# Patient Record
Sex: Male | Born: 1975 | Race: Black or African American | Hispanic: No | Marital: Single | Smoking: Current some day smoker
Health system: Southern US, Community
[De-identification: ages and names within clinical notes are randomized; demographics above are authoritative.]

## PROBLEM LIST (undated history)

## (undated) DIAGNOSIS — Q181 Preauricular sinus and cyst: Secondary | ICD-10-CM

## (undated) DIAGNOSIS — J45909 Unspecified asthma, uncomplicated: Secondary | ICD-10-CM

## (undated) DIAGNOSIS — I10 Essential (primary) hypertension: Secondary | ICD-10-CM

## (undated) DIAGNOSIS — E785 Hyperlipidemia, unspecified: Secondary | ICD-10-CM

## (undated) DIAGNOSIS — E119 Type 2 diabetes mellitus without complications: Secondary | ICD-10-CM

## (undated) DIAGNOSIS — G4730 Sleep apnea, unspecified: Secondary | ICD-10-CM

## (undated) HISTORY — DX: Sleep apnea, unspecified: G47.30

## (undated) HISTORY — DX: Type 2 diabetes mellitus without complications: E11.9

## (undated) HISTORY — PX: ANKLE SURGERY: SHX546

## (undated) HISTORY — DX: Hyperlipidemia, unspecified: E78.5

---

## 2005-09-01 ENCOUNTER — Emergency Department (HOSPITAL_COMMUNITY): Admission: EM | Admit: 2005-09-01 | Discharge: 2005-09-01 | Payer: Self-pay | Admitting: Emergency Medicine

## 2008-08-11 ENCOUNTER — Emergency Department (HOSPITAL_COMMUNITY): Admission: EM | Admit: 2008-08-11 | Discharge: 2008-08-11 | Payer: Self-pay | Admitting: Emergency Medicine

## 2008-08-15 ENCOUNTER — Emergency Department (HOSPITAL_COMMUNITY): Admission: EM | Admit: 2008-08-15 | Discharge: 2008-08-16 | Payer: Self-pay | Admitting: Emergency Medicine

## 2011-06-28 LAB — DIFFERENTIAL
Basophils Absolute: 0
Basophils Absolute: 0
Eosinophils Absolute: 0.2
Eosinophils Relative: 3
Lymphocytes Relative: 40
Lymphs Abs: 2.2
Monocytes Absolute: 0.4
Monocytes Relative: 6
Neutro Abs: 4.4

## 2011-06-28 LAB — URINE MICROSCOPIC-ADD ON

## 2011-06-28 LAB — POCT I-STAT, CHEM 8
BUN: 10
BUN: 13
Calcium, Ion: 1.19
Chloride: 104
Chloride: 105
Creatinine, Ser: 1.5
Glucose, Bld: 119 — ABNORMAL HIGH
HCT: 41
Hemoglobin: 13.9
Potassium: 3 — ABNORMAL LOW
Potassium: 3.2 — ABNORMAL LOW
Sodium: 140
Sodium: 140
TCO2: 28

## 2011-06-28 LAB — URINALYSIS, ROUTINE W REFLEX MICROSCOPIC
Bilirubin Urine: NEGATIVE
Glucose, UA: NEGATIVE
Glucose, UA: NEGATIVE
Ketones, ur: NEGATIVE
Leukocytes, UA: NEGATIVE
Leukocytes, UA: NEGATIVE
Nitrite: NEGATIVE
Protein, ur: NEGATIVE
Protein, ur: NEGATIVE
Specific Gravity, Urine: 1.014
Urobilinogen, UA: 0.2
pH: 7

## 2011-06-28 LAB — CBC
HCT: 39.9
Hemoglobin: 13.6
Hemoglobin: 14.1
MCV: 89.8
Platelets: 224
RBC: 4.44
RDW: 13.7
WBC: 6.8

## 2013-02-24 ENCOUNTER — Encounter (HOSPITAL_BASED_OUTPATIENT_CLINIC_OR_DEPARTMENT_OTHER): Payer: Self-pay | Admitting: *Deleted

## 2013-02-24 ENCOUNTER — Emergency Department (HOSPITAL_BASED_OUTPATIENT_CLINIC_OR_DEPARTMENT_OTHER): Payer: Managed Care, Other (non HMO)

## 2013-02-24 ENCOUNTER — Emergency Department (HOSPITAL_BASED_OUTPATIENT_CLINIC_OR_DEPARTMENT_OTHER)
Admission: EM | Admit: 2013-02-24 | Discharge: 2013-02-24 | Disposition: A | Discharge status: Home / Self Care | Payer: Managed Care, Other (non HMO) | Attending: Emergency Medicine | Admitting: Emergency Medicine

## 2013-02-24 DIAGNOSIS — S99929A Unspecified injury of unspecified foot, initial encounter: Secondary | ICD-10-CM | POA: Insufficient documentation

## 2013-02-24 DIAGNOSIS — S86002A Unspecified injury of left Achilles tendon, initial encounter: Secondary | ICD-10-CM

## 2013-02-24 DIAGNOSIS — S8990XA Unspecified injury of unspecified lower leg, initial encounter: Secondary | ICD-10-CM | POA: Insufficient documentation

## 2013-02-24 DIAGNOSIS — X500XXA Overexertion from strenuous movement or load, initial encounter: Secondary | ICD-10-CM | POA: Insufficient documentation

## 2013-02-24 DIAGNOSIS — Y9361 Activity, american tackle football: Secondary | ICD-10-CM | POA: Insufficient documentation

## 2013-02-24 DIAGNOSIS — Y92009 Unspecified place in unspecified non-institutional (private) residence as the place of occurrence of the external cause: Secondary | ICD-10-CM | POA: Insufficient documentation

## 2013-02-24 DIAGNOSIS — W172XXA Fall into hole, initial encounter: Secondary | ICD-10-CM | POA: Insufficient documentation

## 2013-02-24 MED ORDER — HYDROCODONE-ACETAMINOPHEN 5-325 MG PO TABS: 2.0000 | ORAL_TABLET | ORAL | Status: DC | PRN

## 2013-02-24 NOTE — ED Provider Notes (Signed)
History     CSN: [id number]  Arrival date & time 02/24/13  9528   First MD Initiated Contact with Patient 02/24/13 [id number]      Chief Complaint  Patient presents with  . Ankle Pain    (Consider location/radiation/quality/duration/timing/severity/associated sxs/prior treatment) Patient is a 37 y.o. male presenting with ankle pain. The history is provided by the patient. No language interpreter was used.  Ankle Pain Location:  Ankle Time since incident:  1 day Injury: yes   Ankle location:  L ankle Pain details:    Quality:  Aching, sharp and throbbing   Severity:  Moderate   Onset quality:  Sudden   Duration:  1 day   Timing:  Constant Pt reports he stepped in a hole and heard a pop in his achilles area,   Pt reports decreased ability to flex foot.  History reviewed. No pertinent past medical history.  History reviewed. No pertinent past surgical history.  History reviewed. No pertinent family history.  History  Substance Use Topics  . Smoking status: Not on file  . Smokeless tobacco: Not on file  . Alcohol Use: Not on file      Review of Systems  Musculoskeletal: Positive for joint swelling.  All other systems reviewed and are negative.    Allergies  Review of patient's allergies indicates no known allergies.  Home Medications  No current outpatient prescriptions on file.  BP 171/103  Pulse 98  Temp(Src) 98.2 F (36.8 C) (Oral)  Resp 18  Ht 5\' 10"  (1.778 m)  Wt 275 lb (124.739 kg)  BMI 39.46 kg/m2  SpO2 100%  Physical Exam  Nursing note and vitals reviewed. Constitutional: He is oriented to person, place, and time. He appears well-developed and well-nourished.  HENT:  Head: Normocephalic and atraumatic.  Musculoskeletal: He exhibits tenderness.  Tender achilles,   Limited range of motion,  nv and ns intact  Neurological: He is alert and oriented to person, place, and time. He has normal reflexes.  Skin: Skin is warm.  Psychiatric: He has a normal  mood and affect.    ED Course  Procedures (including critical care time)  Labs Reviewed - No data to display No results found.   1. Achilles tendon injury, left, initial encounter       MDM   Results for orders placed during the hospital encounter of 08/15/08  CBC      Result Value Range   WBC 6.8     RBC 4.44     Hemoglobin 13.6     HCT 39.9     MCV 89.8     MCHC 34.2     RDW 13.8     Platelets 202    DIFFERENTIAL      Result Value Range   Neutrophils Relative % 59     Neutro Abs 4.0     Lymphocytes Relative 32     Lymphs Abs 2.2     Monocytes Relative 6     Monocytes Absolute 0.4     Eosinophils Relative 3     Eosinophils Absolute 0.2     Basophils Relative 0     Basophils Absolute 0.0    URINALYSIS, ROUTINE W REFLEX MICROSCOPIC      Result Value Range   Color, Urine YELLOW     APPearance CLOUDY (*)    Specific Gravity, Urine 1.014     pH 7.0     Glucose, UA NEGATIVE     Hgb urine dipstick LARGE (*)  Bilirubin Urine NEGATIVE     Ketones, ur NEGATIVE     Protein, ur NEGATIVE     Urobilinogen, UA 0.2     Nitrite NEGATIVE     Leukocytes, UA NEGATIVE    URINE MICROSCOPIC-ADD ON      Result Value Range   Squamous Epithelial / LPF RARE     RBC / HPF TOO NUMEROUS TO COUNT     Bacteria, UA RARE    POCT I-STAT, CHEM 8      Result Value Range   Sodium 140     Potassium 3.2 (*)    Chloride 104     BUN 13     Creatinine, Ser 1.5     Glucose, Bld 119 (*)    Calcium, Ion 1.19     TCO2 28     Hemoglobin 13.9     HCT 41.0     Dg Ankle Complete Left  02/24/2013   *RADIOLOGY REPORT*  Clinical Data: Left ankle pain.  LEFT ANKLE COMPLETE - 3+ VIEW  Comparison: None.  Findings: There is some soft tissue swelling about the ankle.  No fracture or dislocation is identified.  No focal bony lesion.  No tibiotalar joint effusion.  IMPRESSION: Mild soft tissue swelling without underlying fracture.   Original Report Authenticated By: Holley Dexter, M.D.   Pt placed  in posterior splint, crutches. Pt advised follow up with Dr. Margreta Journey for evaluation        Elson Areas, PA-C 02/24/13 1052

## 2013-02-24 NOTE — ED Notes (Signed)
Pt amb to room with emt, gait is slow, favoring lle. Pt states "I stepped in a hole while playing football yesterday and I heard my achilles pop" pa is at bedside for exam.

## 2013-02-24 NOTE — ED Provider Notes (Signed)
Medical screening examination/treatment/procedure(s) were performed by non-physician practitioner and as supervising physician I was immediately available for consultation/collaboration.   Charles B. Sheldon, MD 02/24/13 1451 

## 2013-09-17 ENCOUNTER — Emergency Department (HOSPITAL_COMMUNITY): Payer: Managed Care, Other (non HMO)

## 2013-09-17 ENCOUNTER — Emergency Department (HOSPITAL_COMMUNITY)
Admission: EM | Admit: 2013-09-17 | Discharge: 2013-09-17 | Disposition: A | Discharge status: Home / Self Care | Payer: Managed Care, Other (non HMO) | Attending: Emergency Medicine | Admitting: Emergency Medicine

## 2013-09-17 ENCOUNTER — Encounter (HOSPITAL_COMMUNITY): Payer: Self-pay | Admitting: Emergency Medicine

## 2013-09-17 DIAGNOSIS — J159 Unspecified bacterial pneumonia: Secondary | ICD-10-CM | POA: Insufficient documentation

## 2013-09-17 DIAGNOSIS — J9801 Acute bronchospasm: Secondary | ICD-10-CM | POA: Insufficient documentation

## 2013-09-17 DIAGNOSIS — J189 Pneumonia, unspecified organism: Secondary | ICD-10-CM

## 2013-09-17 DIAGNOSIS — F17200 Nicotine dependence, unspecified, uncomplicated: Secondary | ICD-10-CM | POA: Insufficient documentation

## 2013-09-17 DIAGNOSIS — I10 Essential (primary) hypertension: Secondary | ICD-10-CM | POA: Insufficient documentation

## 2013-09-17 HISTORY — DX: Essential (primary) hypertension: I10

## 2013-09-17 LAB — CBC WITH DIFFERENTIAL/PLATELET
Basophils Absolute: 0 10*3/uL (ref 0.0–0.1)
Basophils Relative: 0 % (ref 0–1)
Eosinophils Relative: 1 % (ref 0–5)
HCT: 38.1 % — ABNORMAL LOW (ref 39.0–52.0)
MCHC: 33.6 g/dL (ref 30.0–36.0)
MCV: 88 fL (ref 78.0–100.0)
Monocytes Absolute: 0.6 10*3/uL (ref 0.1–1.0)
Platelets: 168 10*3/uL (ref 150–400)
RDW: 13.8 % (ref 11.5–15.5)

## 2013-09-17 LAB — BASIC METABOLIC PANEL
Calcium: 8.2 mg/dL — ABNORMAL LOW (ref 8.4–10.5)
Creatinine, Ser: 1.31 mg/dL (ref 0.50–1.35)
GFR calc Af Amer: 80 mL/min — ABNORMAL LOW (ref >90)
GFR calc non Af Amer: 69 mL/min — ABNORMAL LOW (ref >90)

## 2013-09-17 MED ORDER — CYCLOBENZAPRINE HCL 10 MG PO TABS: 10.0000 mg | ORAL_TABLET | Freq: Three times a day (TID) | ORAL | Status: DC | PRN

## 2013-09-17 MED ORDER — DEXTROSE 5 % IV SOLN
500.0000 mg | Freq: Once | INTRAVENOUS | Status: AC
  Administered 2013-09-17: 500 mg via INTRAVENOUS

## 2013-09-17 MED ORDER — ALBUTEROL SULFATE (5 MG/ML) 0.5% IN NEBU
5.0000 mg | INHALATION_SOLUTION | Freq: Once | RESPIRATORY_TRACT | Status: AC
  Administered 2013-09-17: 5 mg via RESPIRATORY_TRACT
  Filled 2013-09-17: qty 1

## 2013-09-17 MED ORDER — AEROCHAMBER PLUS FLO-VU MEDIUM MISC
1.0000 | Freq: Once | Status: AC
  Administered 2013-09-17: 1
  Filled 2013-09-17: qty 1

## 2013-09-17 MED ORDER — ALBUTEROL SULFATE HFA 108 (90 BASE) MCG/ACT IN AERS: 2.0000 | INHALATION_SPRAY | RESPIRATORY_TRACT | Status: DC | PRN

## 2013-09-17 MED ORDER — IPRATROPIUM BROMIDE 0.02 % IN SOLN
0.5000 mg | Freq: Once | RESPIRATORY_TRACT | Status: AC
  Administered 2013-09-17: 0.5 mg via RESPIRATORY_TRACT
  Filled 2013-09-17: qty 2.5

## 2013-09-17 MED ORDER — LEVOFLOXACIN 750 MG PO TABS: 750.0000 mg | ORAL_TABLET | Freq: Every day | ORAL | Status: DC

## 2013-09-17 MED ORDER — KETOROLAC TROMETHAMINE 30 MG/ML IJ SOLN
30.0000 mg | Freq: Once | INTRAMUSCULAR | Status: AC
  Administered 2013-09-17: 30 mg via INTRAVENOUS
  Filled 2013-09-17: qty 1

## 2013-09-17 MED ORDER — NAPROXEN 500 MG PO TABS: 500.0000 mg | ORAL_TABLET | Freq: Two times a day (BID) | ORAL | Status: DC

## 2013-09-17 MED ORDER — SODIUM CHLORIDE 0.9 % IV SOLN
INTRAVENOUS | Status: DC
  Administered 2013-09-17: 12:00:00 via INTRAVENOUS

## 2013-09-17 MED ORDER — DEXTROSE 5 % IV SOLN
1.0000 g | Freq: Once | INTRAVENOUS | Status: AC
  Administered 2013-09-17: 1 g via INTRAVENOUS
  Filled 2013-09-17: qty 10

## 2013-09-17 MED ORDER — ACETAMINOPHEN 500 MG PO TABS
1000.0000 mg | ORAL_TABLET | Freq: Once | ORAL | Status: AC
  Administered 2013-09-17: 1000 mg via ORAL
  Filled 2013-09-17: qty 2

## 2013-09-17 MED ORDER — SODIUM CHLORIDE 0.9 % IV BOLUS (SEPSIS)
1000.0000 mL | Freq: Once | INTRAVENOUS | Status: AC
  Administered 2013-09-17: 1000 mL via INTRAVENOUS

## 2013-09-17 NOTE — ED Notes (Signed)
Pt reports having productive cough and fever, temp 102.9 at triage.

## 2013-09-17 NOTE — ED Notes (Signed)
Pt's Pulse-ox while ambulating was 97% RA. RN Andreas Ohm notified.

## 2013-09-17 NOTE — ED Provider Notes (Signed)
CSN: [id number]     Arrival date & time 09/17/13  1005 History   First MD Initiated Contact with Patient 09/17/13 1018     Chief Complaint  Patient presents with  . Cough  . Fever   (Consider location/radiation/quality/duration/timing/severity/associated sxs/prior Treatment) HPI Patient reports his son was diagnosed with pneumonia on the 19th. His son is 36 years old. Patient has been taking care of him. He reports his son is doing better. However yesterday patient started getting chills and fever. He has a cough with yellow sputum production. He has some clear rhinorrhea that is mild. He has a mild sore throat when he coughs. He does complain of a lot of chest pain anteriorly and posteriorly when he coughs. He denies feeling better that he is struggling to breathe in does not think he is having wheezing. He denies nausea, vomiting, or diarrhea. He states he did not have childhood asthma however he has used an inhaler in the past but cannot recall what for.  PCP Dr Carlota Raspberry in [place]  Past Medical History  Diagnosis Date  . Hypertension    History reviewed. No pertinent past surgical history. History reviewed. No pertinent family history. History  Substance Use Topics  . Smoking status: Current Every Day Smoker    Types: Cigarettes  . Smokeless tobacco: Not on file  . Alcohol Use: No  lives at home Employed Smokes 3-4 cigs a day  Review of Systems  Allergies  Review of patient's allergies indicates no known allergies.  Home Medications  None  BP 159/103  Pulse 113  Temp(Src) 102.9 F (39.4 C) (Oral)  Resp 18  SpO2 96%  Vital signs normal except hypertension, tachycardia, and fever  Physical Exam  Nursing note and vitals reviewed. Constitutional: He is oriented to person, place, and time. He appears well-developed and well-nourished.  Non-toxic appearance. He does not appear ill. No distress.  HENT:  Head: Normocephalic and atraumatic.  Right Ear: External  ear normal.  Left Ear: External ear normal.  Nose: Nose normal. No mucosal edema or rhinorrhea.  Mouth/Throat: Oropharynx is clear and moist and mucous membranes are normal. No dental abscesses or uvula swelling.  Eyes: Conjunctivae and EOM are normal. Pupils are equal, round, and reactive to light.  Neck: Normal range of motion and full passive range of motion without pain. Neck supple.  Cardiovascular: Normal rate, regular rhythm and normal heart sounds.  Exam reveals no gallop and no friction rub.   No murmur heard. Pulmonary/Chest: Effort normal. No respiratory distress. He has decreased breath sounds. He has no wheezes. He has no rhonchi. He has no rales. He exhibits no tenderness and no crepitus.  Initially had some late and expiratory wheezing however after he coughed it improved. He does have some diminished breath sounds. Coughing frequently.   Abdominal: Soft. Normal appearance and bowel sounds are normal. He exhibits no distension. There is no tenderness. There is no rebound and no guarding.  Musculoskeletal: Normal range of motion. He exhibits no edema and no tenderness.  Moves all extremities well.   Neurological: He is alert and oriented to person, place, and time. He has normal strength. No cranial nerve deficit.  Skin: Skin is warm, dry and intact. No rash noted. No erythema. No pallor.  Psychiatric: He has a normal mood and affect. His speech is normal and behavior is normal. His mood appears not anxious.    ED Course  Procedures (including critical care time) Medications  0.9 %  sodium  chloride infusion ( Intravenous New Bag/Given 09/17/13 1135)  AEROCHAMBER PLUS FLO-VU MEDIUM device MISC 1 each (not administered)  albuterol (PROVENTIL) (5 MG/ML) 0.5% nebulizer solution 5 mg (5 mg Nebulization Given 09/17/13 1108)  ipratropium (ATROVENT) nebulizer solution 0.5 mg (0.5 mg Nebulization Given 09/17/13 1108)  ketorolac (TORADOL) 30 MG/ML injection 30 mg (30 mg Intravenous Given  09/17/13 1121)  sodium chloride 0.9 % bolus 1,000 mL (0 mLs Intravenous Stopped 09/17/13 1334)  acetaminophen (TYLENOL) tablet 1,000 mg (1,000 mg Oral Given 09/17/13 1109)  cefTRIAXone (ROCEPHIN) 1 g in dextrose 5 % 50 mL IVPB (0 g Intravenous Stopped 09/17/13 1218)  azithromycin (ZITHROMAX) 500 mg in dextrose 5 % 250 mL IVPB (0 mg Intravenous Stopped 09/17/13 1334)  albuterol (PROVENTIL) (5 MG/ML) 0.5% nebulizer solution 5 mg (5 mg Nebulization Given 09/17/13 1502)  ipratropium (ATROVENT) nebulizer solution 0.5 mg (0.5 mg Nebulization Given 09/17/13 1502)   Pt ambulated by nursing staff and his pulse ox remained 96% on RA and he said it made him cough more which made his chest hurt.  Recheck after 1st nebulizer.PT now has increased air movement and diffuse wheezing.   Pt given second nebulizer, recheck now has clear breath sounds. Feels he can go home.    Labs Review  Results for orders placed during the hospital encounter of 09/17/13  CBC WITH DIFFERENTIAL      Result Value Range   WBC 5.1  4.0 - 10.5 K/uL   RBC 4.33  4.22 - 5.81 MIL/uL   Hemoglobin 12.8 (*) 13.0 - 17.0 g/dL   HCT 41.3 (*) 24.4 - 01.0 %   MCV 88.0  78.0 - 100.0 fL   MCH 29.6  26.0 - 34.0 pg   MCHC 33.6  30.0 - 36.0 g/dL   RDW 27.2  53.6 - 64.4 %   Platelets 168  150 - 400 K/uL   Neutrophils Relative % 69  43 - 77 %   Neutro Abs 3.5  1.7 - 7.7 K/uL   Lymphocytes Relative 18  12 - 46 %   Lymphs Abs 0.9  0.7 - 4.0 K/uL   Monocytes Relative 12  3 - 12 %   Monocytes Absolute 0.6  0.1 - 1.0 K/uL   Eosinophils Relative 1  0 - 5 %   Eosinophils Absolute 0.0  0.0 - 0.7 K/uL   Basophils Relative 0  0 - 1 %   Basophils Absolute 0.0  0.0 - 0.1 K/uL  BASIC METABOLIC PANEL      Result Value Range   Sodium 139  135 - 145 mEq/L   Potassium 3.2 (*) 3.5 - 5.1 mEq/L   Chloride 103  96 - 112 mEq/L   CO2 25  19 - 32 mEq/L   Glucose, Bld 105 (*) 70 - 99 mg/dL   BUN 13  6 - 23 mg/dL   Creatinine, Ser 0.34  0.50 - 1.35 mg/dL    Calcium 8.2 (*) 8.4 - 10.5 mg/dL   GFR calc non Af Amer 69 (*) >90 mL/min   GFR calc Af Amer 80 (*) >90 mL/min    Imaging Review Dg Chest 2 View  09/17/2013   CLINICAL DATA:  Cough and fever  EXAM: CHEST  2 VIEW  COMPARISON:  None.  FINDINGS: There is mild atelectasis in the medial left base. Lungs otherwise clear. Heart is upper normal in size with normal pulmonary vascularity. No adenopathy. No bone lesions.  IMPRESSION: Mild atelectasis medial left base. This finding could represent earliest  changes of focal pneumonia. Lungs otherwise clear.   Electronically Signed   By: Bretta Bang M.D.   On: 09/17/2013 11:07    EKG Interpretation   None       MDM   1. CAP (community acquired pneumonia)   2. Bronchospasm     New Prescriptions   ALBUTEROL (PROVENTIL HFA;VENTOLIN HFA) 108 (90 BASE) MCG/ACT INHALER    Inhale 2 puffs into the lungs every 4 (four) hours as needed for wheezing or shortness of breath.   CYCLOBENZAPRINE (FLEXERIL) 10 MG TABLET    Take 1 tablet (10 mg total) by mouth 3 (three) times daily as needed for muscle spasms.   LEVOFLOXACIN (LEVAQUIN) 750 MG TABLET    Take 1 tablet (750 mg total) by mouth daily.   NAPROXEN (NAPROSYN) 500 MG TABLET    Take 1 tablet (500 mg total) by mouth 2 (two) times daily.    Plan discharge   Devoria Albe, MD, Franz Dell, MD 09/17/13 [phone]

## 2013-09-17 NOTE — ED Notes (Signed)
PEDS ED contacted will tube aerochamber.

## 2014-06-17 ENCOUNTER — Emergency Department (HOSPITAL_COMMUNITY): Payer: Managed Care, Other (non HMO)

## 2014-06-17 ENCOUNTER — Encounter (HOSPITAL_COMMUNITY): Payer: Self-pay | Admitting: Emergency Medicine

## 2014-06-17 ENCOUNTER — Emergency Department (HOSPITAL_COMMUNITY)
Admission: EM | Admit: 2014-06-17 | Discharge: 2014-06-17 | Disposition: A | Discharge status: Home / Self Care | Payer: Managed Care, Other (non HMO) | Attending: Emergency Medicine | Admitting: Emergency Medicine

## 2014-06-17 DIAGNOSIS — Z792 Long term (current) use of antibiotics: Secondary | ICD-10-CM | POA: Diagnosis not present

## 2014-06-17 DIAGNOSIS — Z79899 Other long term (current) drug therapy: Secondary | ICD-10-CM | POA: Diagnosis not present

## 2014-06-17 DIAGNOSIS — F17200 Nicotine dependence, unspecified, uncomplicated: Secondary | ICD-10-CM | POA: Diagnosis not present

## 2014-06-17 DIAGNOSIS — IMO0001 Reserved for inherently not codable concepts without codable children: Secondary | ICD-10-CM

## 2014-06-17 DIAGNOSIS — R030 Elevated blood-pressure reading, without diagnosis of hypertension: Secondary | ICD-10-CM

## 2014-06-17 DIAGNOSIS — R51 Headache: Secondary | ICD-10-CM | POA: Diagnosis not present

## 2014-06-17 DIAGNOSIS — I10 Essential (primary) hypertension: Secondary | ICD-10-CM | POA: Diagnosis not present

## 2014-06-17 DIAGNOSIS — R519 Headache, unspecified: Secondary | ICD-10-CM

## 2014-06-17 DIAGNOSIS — Z791 Long term (current) use of non-steroidal anti-inflammatories (NSAID): Secondary | ICD-10-CM | POA: Insufficient documentation

## 2014-06-17 LAB — CBC
HCT: 43.3 % (ref 39.0–52.0)
HEMOGLOBIN: 14.4 g/dL (ref 13.0–17.0)
MCH: 29 pg (ref 26.0–34.0)
MCHC: 33.3 g/dL (ref 30.0–36.0)
MCV: 87.3 fL (ref 78.0–100.0)
PLATELETS: 211 10*3/uL (ref 150–400)
RBC: 4.96 MIL/uL (ref 4.22–5.81)
RDW: 13.8 % (ref 11.5–15.5)
WBC: 7.1 10*3/uL (ref 4.0–10.5)

## 2014-06-17 LAB — BASIC METABOLIC PANEL
Anion gap: 11 (ref 5–15)
BUN: 10 mg/dL (ref 6–23)
CALCIUM: 9.4 mg/dL (ref 8.4–10.5)
CO2: 28 mEq/L (ref 19–32)
Chloride: 98 mEq/L (ref 96–112)
Creatinine, Ser: 1.05 mg/dL (ref 0.50–1.35)
GFR calc non Af Amer: 89 mL/min — ABNORMAL LOW (ref >90)
GLUCOSE: 109 mg/dL — AB (ref 70–99)
POTASSIUM: 3.8 meq/L (ref 3.7–5.3)
Sodium: 137 mEq/L (ref 137–147)

## 2014-06-17 LAB — I-STAT TROPONIN, ED: TROPONIN I, POC: 0.02 ng/mL (ref 0.00–0.08)

## 2014-06-17 MED ORDER — METOCLOPRAMIDE HCL 5 MG/ML IJ SOLN
10.0000 mg | Freq: Once | INTRAMUSCULAR | Status: AC
  Administered 2014-06-17: 10 mg via INTRAVENOUS
  Filled 2014-06-17: qty 2

## 2014-06-17 MED ORDER — DIPHENHYDRAMINE HCL 50 MG/ML IJ SOLN
25.0000 mg | Freq: Once | INTRAMUSCULAR | Status: AC
  Administered 2014-06-17: 25 mg via INTRAVENOUS
  Filled 2014-06-17: qty 1

## 2014-06-17 MED ORDER — KETOROLAC TROMETHAMINE 30 MG/ML IJ SOLN
30.0000 mg | Freq: Once | INTRAMUSCULAR | Status: AC
  Administered 2014-06-17: 30 mg via INTRAVENOUS
  Filled 2014-06-17: qty 1

## 2014-06-17 MED ORDER — KETOROLAC TROMETHAMINE 30 MG/ML IJ SOLN: 30.0000 mg | Freq: Once | INTRAMUSCULAR | Status: DC

## 2014-06-17 MED ORDER — SODIUM CHLORIDE 0.9 % IV BOLUS (SEPSIS)
1000.0000 mL | Freq: Once | INTRAVENOUS | Status: AC
  Administered 2014-06-17: 1000 mL via INTRAVENOUS

## 2014-06-17 NOTE — ED Provider Notes (Signed)
CSN: [id number]     Arrival date & time 06/17/14  0520 History   First MD Initiated Contact with Patient 06/17/14 0725     Chief Complaint  Patient presents with  . Headache     (Consider location/radiation/quality/duration/timing/severity/associated sxs/prior Treatment) HPI Comments: Patient is a 38 year old male with past medical history of hypertension who presents to the emergency department complaining of gradual onset headache beginning around 3:00 AM today while he was on his way to work. Headache described as throbbing, 8/10, bilateral in the temporal area, constant with associated nausea and vomiting. He tried taking a BC powder with no relief as he vomited it back up. Denies vision change, confusion, numbness, tingling, photophobia or phonophobia. He reports yesterday he was experiencing intermittent chest pain described as sharp, worse when he took a deep breath. Chest pain lasted throughout the day yesterday, he went to the nurse at his work told him that his blood pressure was elevated. He states he is compliant with his blood pressure medications and took them last night. No chest pain today. Denies associated shortness of breath. Denies fevers, neck pain or stiffness. Denies hx of migraines. Denies family hx of early heart disease.  Patient is a 38 y.o. male presenting with headaches. The history is provided by the patient.  Headache Associated symptoms: nausea and vomiting     Past Medical History  Diagnosis Date  . Hypertension    Past Surgical History  Procedure Laterality Date  . Ankle surgery     No family history on file. History  Substance Use Topics  . Smoking status: Current Every Day Smoker    Types: Cigarettes  . Smokeless tobacco: Not on file  . Alcohol Use: No    Review of Systems  Cardiovascular: Positive for chest pain (1 day ago, subsided).  Gastrointestinal: Positive for nausea and vomiting.  Neurological: Positive for headaches.  All other  systems reviewed and are negative.     Allergies  Review of patient's allergies indicates no known allergies.  Home Medications   Prior to Admission medications   Medication Sig Start Date End Date Taking? Authorizing Provider  albuterol (PROVENTIL HFA;VENTOLIN HFA) 108 (90 BASE) MCG/ACT inhaler Inhale 2 puffs into the lungs every 4 (four) hours as needed for wheezing or shortness of breath. 09/17/13   Ward Givens, MD  cyclobenzaprine (FLEXERIL) 10 MG tablet Take 1 tablet (10 mg total) by mouth 3 (three) times daily as needed for muscle spasms. 09/17/13   Ward Givens, MD  levofloxacin (LEVAQUIN) 750 MG tablet Take 1 tablet (750 mg total) by mouth daily. 09/17/13   Ward Givens, MD  naproxen (NAPROSYN) 500 MG tablet Take 1 tablet (500 mg total) by mouth 2 (two) times daily. 09/17/13   Ward Givens, MD   BP 154/107  Pulse 85  Temp(Src) 97.8 F (36.6 C) (Oral)  Resp 14  Ht  (1.778 m)  Wt 280 lb (127.007 kg)  BMI 40.18 kg/m2  SpO2 97% Physical Exam  Nursing note and vitals reviewed. Constitutional: He is oriented to person, place, and time. He appears well-developed and well-nourished. No distress.  HENT:  Head: Normocephalic and atraumatic.  Mouth/Throat: Oropharynx is clear and moist.  Eyes: Conjunctivae and EOM are normal. Pupils are equal, round, and reactive to light.  Neck: Normal range of motion. Neck supple. No JVD present.  Cardiovascular: Normal rate, regular rhythm, normal heart sounds and intact distal pulses.   No extremity edema.  Pulmonary/Chest: Effort normal  and breath sounds normal. No respiratory distress.  Abdominal: Soft. Bowel sounds are normal. There is no tenderness.  Musculoskeletal: Normal range of motion. He exhibits no edema.  Neurological: He is alert and oriented to person, place, and time. He has normal strength. No cranial nerve deficit or sensory deficit. He displays a negative Romberg sign. Coordination normal.  Speech fluent, goal  oriented. Moves limbs without ataxia. Equal grip strength bilateral.  Skin: Skin is warm and dry. He is not diaphoretic.  Psychiatric: He has a normal mood and affect. His behavior is normal.    ED Course  Procedures (including critical care time) Labs Review Labs Reviewed  BASIC METABOLIC PANEL - Abnormal; Notable for the following:    Glucose, Bld 109 (*)    GFR calc non Af Amer 89 (*)    All other components within normal limits  CBC  I-STAT TROPOININ, ED    Imaging Review Dg Chest 2 View  06/17/2014   CLINICAL DATA:  Chest pain  EXAM: CHEST  2 VIEW  COMPARISON:  September 17, 2013  FINDINGS: There is no edema or consolidation. There is minimal scarring in the left base. Heart is upper normal in size with pulmonary vascularity within normal limits. No adenopathy. No pneumothorax. No bone lesions.  IMPRESSION: Slight scarring left base.  No edema or consolidation.   Electronically Signed   By: Bretta Bang M.D.   On: 06/17/2014 07:48   Ct Head Wo Contrast  06/17/2014   CLINICAL DATA:  Sudden onset of headache in 3 a.m. today  EXAM: CT HEAD WITHOUT CONTRAST  TECHNIQUE: Contiguous axial images were obtained from the base of the skull through the vertex without intravenous contrast.  COMPARISON:  None.  FINDINGS: The ventricles are normal in size and position. There is no intracranial hemorrhage nor intracranial mass effect. There is a cavum septum pellucidum. There is no acute ischemic change. The cerebellum and brainstem are unremarkable  The observed portions of the paranasal sinuses and mastoid air cells are clear. There is no lytic or blastic skull lesion nor evidence of an acute fracture.  IMPRESSION: There is no acute intracranial abnormality.   Electronically Signed   By: David  [place]   On: 06/17/2014 09:26     EKG Interpretation None      MDM   Final diagnoses:  Nonintractable headache  Elevated blood pressure   Pt presenting with headache, episode of chest pain 1  day ago. He is non-toxic appearing and in NAD. Afebrile. Hypertensive vitals otherwise stable. No focal neuro deficits. No meningeal signs. Given this is pt's first severe headache with associated n/v, will obtain head CT. Cardiac workup pending. Low suspicion for cardiac in origin.  9:52 AM Labs, CXR, head CT without any acute finding. HEART score 3. Pt reports improved nausea with reglan and benadryl, however headache still present. Will give toradol.  10:39 AM Pt reports significant improvement of his headache after receiving toradol. BP improved to 140/80. Stable for d/c. F/u with PCP. Return precautions given. Patient states understanding of treatment care plan and is agreeable.  Case discussed with attending Dr. Radford Pax who also evaluated patient and agrees with plan of care.   Trevor Mace, PA-C 06/17/14 1041

## 2014-06-17 NOTE — ED Provider Notes (Signed)
Medical screening examination/treatment/procedure(s) were conducted as a shared visit with non-physician practitioner(s) and myself.  I personally evaluated the patient during the encounter   .Face to face Exam:  General:  A&Ox3 HEENT:  Atraumatic Resp:  Normal effort Abd:  Nondistended Neuro:No focal deficits     Nelia Shi, MD 06/17/14 1043

## 2014-06-17 NOTE — Discharge Instructions (Signed)
Follow up with your primary care doctor.  General Headache Without Cause A headache is pain or discomfort felt around the head or neck area. The specific cause of a headache may not be found. There are many causes and types of headaches. A few common ones are:  Tension headaches.  Migraine headaches.  Cluster headaches.  Chronic daily headaches. HOME CARE INSTRUCTIONS   Keep all follow-up appointments with your caregiver or any specialist referral.  Only take over-the-counter or prescription medicines for pain or discomfort as directed by your caregiver.  Lie down in a dark, quiet room when you have a headache.  Keep a headache journal to find out what may trigger your migraine headaches. For example, write down:  What you eat and drink.  How much sleep you get.  Any change to your diet or medicines.  Try massage or other relaxation techniques.  Put ice packs or heat on the head and neck. Use these 3 to 4 times per day for 15 to 20 minutes each time, or as needed.  Limit stress.  Sit up straight, and do not tense your muscles.  Quit smoking if you smoke.  Limit alcohol use.  Decrease the amount of caffeine you drink, or stop drinking caffeine.  Eat and sleep on a regular schedule.  Get 7 to 9 hours of sleep, or as recommended by your caregiver.  Keep lights dim if bright lights bother you and make your headaches worse. SEEK MEDICAL CARE IF:   You have problems with the medicines you were prescribed.  Your medicines are not working.  You have a change from the usual headache.  You have nausea or vomiting. SEEK IMMEDIATE MEDICAL CARE IF:   Your headache becomes severe.  You have a fever.  You have a stiff neck.  You have loss of vision.  You have muscular weakness or loss of muscle control.  You start losing your balance or have trouble walking.  You feel faint or pass out.  You have severe symptoms that are different from your first  symptoms. MAKE SURE YOU:   Understand these instructions.  Will watch your condition.  Will get help right away if you are not doing well or get worse. Document Released: 09/11/2005 Document Revised: 12/04/2011 Document Reviewed: 09/27/2011 [facility] Patient Information 2015 [place], [place]. This information is not intended to replace advice given to you by your health care provider. Make sure you discuss any questions you have with your health care provider.  Headaches, Frequently Asked Questions MIGRAINE HEADACHES Q: What is migraine? What causes it? How can I treat it? A: Generally, migraine headaches begin as a dull ache. Then they develop into a constant, throbbing, and pulsating pain. You may experience pain at the temples. You may experience pain at the front or back of one or both sides of the head. The pain is usually accompanied by a combination of:  Nausea.  Vomiting.  Sensitivity to light and noise. Some people (about 15%) experience an aura (see below) before an attack. The cause of migraine is believed to be chemical reactions in the brain. Treatment for migraine may include over-the-counter or prescription medications. It may also include self-help techniques. These include relaxation training and biofeedback.  Q: What is an aura? A: About 15% of people with migraine get an "aura". This is a sign of neurological symptoms that occur before a migraine headache. You may see wavy or jagged lines, dots, or flashing lights. You might experience tunnel vision or  blind spots in one or both eyes. The aura can include visual or auditory hallucinations (something imagined). It may include disruptions in smell (such as strange odors), taste or touch. Other symptoms include:  Numbness.  A "pins and needles" sensation.  Difficulty in recalling or speaking the correct word. These neurological events may last as long as 60 minutes. These symptoms will fade as the headache begins. Q: What  is a trigger? A: Certain physical or environmental factors can lead to or "trigger" a migraine. These include:  Foods.  Hormonal changes.  Weather.  Stress. It is important to remember that triggers are different for everyone. To help prevent migraine attacks, you need to figure out which triggers affect you. Keep a headache diary. This is a good way to track triggers. The diary will help you talk to your healthcare professional about your condition. Q: Does weather affect migraines? A: Bright sunshine, hot, humid conditions, and drastic changes in barometric pressure may lead to, or "trigger," a migraine attack in some people. But studies have shown that weather does not act as a trigger for everyone with migraines. Q: What is the link between migraine and hormones? A: Hormones start and regulate many of your body's functions. Hormones keep your body in balance within a constantly changing environment. The levels of hormones in your body are unbalanced at times. Examples are during menstruation, pregnancy, or menopause. That can lead to a migraine attack. In fact, about three quarters of all women with migraine report that their attacks are related to the menstrual cycle.  Q: Is there an increased risk of stroke for migraine sufferers? A: The likelihood of a migraine attack causing a stroke is very remote. That is not to say that migraine sufferers cannot have a stroke associated with their migraines. In persons under age 29, the most common associated factor for stroke is migraine headache. But over the course of a person's normal life span, the occurrence of migraine headache may actually be associated with a reduced risk of dying from cerebrovascular disease due to stroke.  Q: What are acute medications for migraine? A: Acute medications are used to treat the pain of the headache after it has started. Examples over-the-counter medications, NSAIDs, ergots, and triptans.  Q: What are the  triptans? A: Triptans are the newest class of abortive medications. They are specifically targeted to treat migraine. Triptans are vasoconstrictors. They moderate some chemical reactions in the brain. The triptans work on receptors in your brain. Triptans help to restore the balance of a neurotransmitter called serotonin. Fluctuations in levels of serotonin are thought to be a main cause of migraine.  Q: Are over-the-counter medications for migraine effective? A: Over-the-counter, or "OTC," medications may be effective in relieving mild to moderate pain and associated symptoms of migraine. But you should see your caregiver before beginning any treatment regimen for migraine.  Q: What are preventive medications for migraine? A: Preventive medications for migraine are sometimes referred to as "prophylactic" treatments. They are used to reduce the frequency, severity, and length of migraine attacks. Examples of preventive medications include antiepileptic medications, antidepressants, beta-blockers, calcium channel blockers, and NSAIDs (nonsteroidal anti-inflammatory drugs). Q: Why are anticonvulsants used to treat migraine? A: During the past few years, there has been an increased interest in antiepileptic drugs for the prevention of migraine. They are sometimes referred to as "anticonvulsants". Both epilepsy and migraine may be caused by similar reactions in the brain.  Q: Why are antidepressants used to treat migraine? A:  Antidepressants are typically used to treat people with depression. They may reduce migraine frequency by regulating chemical levels, such as serotonin, in the brain.  Q: What alternative therapies are used to treat migraine? A: The term "alternative therapies" is often used to describe treatments considered outside the scope of conventional Western medicine. Examples of alternative therapy include acupuncture, acupressure, and yoga. Another common alternative treatment is herbal  therapy. Some herbs are believed to relieve headache pain. Always discuss alternative therapies with your caregiver before proceeding. Some herbal products contain arsenic and other toxins. TENSION HEADACHES Q: What is a tension-type headache? What causes it? How can I treat it? A: Tension-type headaches occur randomly. They are often the result of temporary stress, anxiety, fatigue, or anger. Symptoms include soreness in your temples, a tightening band-like sensation around your head (a "vice-like" ache). Symptoms can also include a pulling feeling, pressure sensations, and contracting head and neck muscles. The headache begins in your forehead, temples, or the back of your head and neck. Treatment for tension-type headache may include over-the-counter or prescription medications. Treatment may also include self-help techniques such as relaxation training and biofeedback. CLUSTER HEADACHES Q: What is a cluster headache? What causes it? How can I treat it? A: Cluster headache gets its name because the attacks come in groups. The pain arrives with little, if any, warning. It is usually on one side of the head. A tearing or bloodshot eye and a runny nose on the same side of the headache may also accompany the pain. Cluster headaches are believed to be caused by chemical reactions in the brain. They have been described as the most severe and intense of any headache type. Treatment for cluster headache includes prescription medication and oxygen. SINUS HEADACHES Q: What is a sinus headache? What causes it? How can I treat it? A: When a cavity in the bones of the face and skull (a sinus) becomes inflamed, the inflammation will cause localized pain. This condition is usually the result of an allergic reaction, a tumor, or an infection. If your headache is caused by a sinus blockage, such as an infection, you will probably have a fever. An x-ray will confirm a sinus blockage. Your caregiver's treatment might  include antibiotics for the infection, as well as antihistamines or decongestants.  REBOUND HEADACHES Q: What is a rebound headache? What causes it? How can I treat it? A: A pattern of taking acute headache medications too often can lead to a condition known as "rebound headache." A pattern of taking too much headache medication includes taking it more than 2 days per week or in excessive amounts. That means more than the label or a caregiver advises. With rebound headaches, your medications not only stop relieving pain, they actually begin to cause headaches. Doctors treat rebound headache by tapering the medication that is being overused. Sometimes your caregiver will gradually substitute a different type of treatment or medication. Stopping may be a challenge. Regularly overusing a medication increases the potential for serious side effects. Consult a caregiver if you regularly use headache medications more than 2 days per week or more than the label advises. ADDITIONAL QUESTIONS AND ANSWERS Q: What is biofeedback? A: Biofeedback is a self-help treatment. Biofeedback uses special equipment to monitor your body's involuntary physical responses. Biofeedback monitors:  Breathing.  Pulse.  Heart rate.  Temperature.  Muscle tension.  Brain activity. Biofeedback helps you refine and perfect your relaxation exercises. You learn to control the physical responses that are related to  stress. Once the technique has been mastered, you do not need the equipment any more. Q: Are headaches hereditary? A: Four out of five (80%) of people that suffer report a family history of migraine. Scientists are not sure if this is genetic or a family predisposition. Despite the uncertainty, a child has a 50% chance of having migraine if one parent suffers. The child has a 75% chance if both parents suffer.  Q: Can children get headaches? A: By the time they reach high school, most young people have experienced some  type of headache. Many safe and effective approaches or medications can prevent a headache from occurring or stop it after it has begun.  Q: What type of doctor should I see to diagnose and treat my headache? A: Start with your primary caregiver. Discuss his or her experience and approach to headaches. Discuss methods of classification, diagnosis, and treatment. Your caregiver may decide to recommend you to a headache specialist, depending upon your symptoms or other physical conditions. Having diabetes, allergies, etc., may require a more comprehensive and inclusive approach to your headache. The National Headache Foundation will provide, upon request, a list of [facility] physician members in your state. Document Released: 12/02/2003 Document Revised: 12/04/2011 Document Reviewed: 05/11/2008 [facility] Patient Information 2015 [place], [place]. This information is not intended to replace advice given to you by your health care provider. Make sure you discuss any questions you have with your health care provider.  Managing Your High Blood Pressure Blood pressure is a measurement of how forceful your blood is pressing against the walls of the arteries. Arteries are muscular tubes within the circulatory system. Blood pressure does not stay the same. Blood pressure rises when you are active, excited, or nervous; and it lowers during sleep and relaxation. If the numbers measuring your blood pressure stay above normal most of the time, you are at risk for health problems. High blood pressure (hypertension) is a long-term (chronic) condition in which blood pressure is elevated. A blood pressure reading is recorded as two numbers, such as 120 over 80 (or 120/80). The first, higher number is called the systolic pressure. It is a measure of the pressure in your arteries as the heart beats. The second, lower number is called the diastolic pressure. It is a measure of the pressure in your arteries as the heart relaxes between  beats.  Keeping your blood pressure in a normal range is important to your overall health and prevention of health problems, such as heart disease and stroke. When your blood pressure is uncontrolled, your heart has to work harder than normal. High blood pressure is a very common condition in adults because blood pressure tends to rise with age. Men and women are equally likely to have hypertension but at different times in life. Before age 12, men are more likely to have hypertension. After 37 years of age, women are more likely to have it. Hypertension is especially common in African Americans. This condition often has no signs or symptoms. The cause of the condition is usually not known. Your caregiver can help you come up with a plan to keep your blood pressure in a normal, healthy range. BLOOD PRESSURE STAGES Blood pressure is classified into four stages: normal, prehypertension, stage 1, and stage 2. Your blood pressure reading will be used to determine what type of treatment, if any, is necessary. Appropriate treatment options are tied to these four stages:  Normal  Systolic pressure (mm Hg): below 120.  Diastolic pressure (  mm Hg): below 80. Prehypertension  Systolic pressure (mm Hg): 120 to 139.  Diastolic pressure (mm Hg): 80 to 89. Stage1  Systolic pressure (mm Hg): 140 to 159.  Diastolic pressure (mm Hg): 90 to 99. Stage2  Systolic pressure (mm Hg): 160 or above.  Diastolic pressure (mm Hg): 100 or above. RISKS RELATED TO HIGH BLOOD PRESSURE Managing your blood pressure is an important responsibility. Uncontrolled high blood pressure can lead to:  A heart attack.  A stroke.  A weakened blood vessel (aneurysm).  Heart failure.  Kidney damage.  Eye damage.  Metabolic syndrome.  Memory and concentration problems. HOW TO MANAGE YOUR BLOOD PRESSURE Blood pressure can be managed effectively with lifestyle changes and medicines (if needed). Your caregiver will help  you come up with a plan to bring your blood pressure within a normal range. Your plan should include the following: Education  Read all information provided by your caregivers about how to control blood pressure.  Educate yourself on the latest guidelines and treatment recommendations. New research is always being done to further define the risks and treatments for high blood pressure. Lifestylechanges  Control your weight.  Avoid smoking.  Stay physically active.  Reduce the amount of salt in your diet.  Reduce stress.  Control any chronic conditions, such as high cholesterol or diabetes.  Reduce your alcohol intake. Medicines  Several medicines (antihypertensive medicines) are available, if needed, to bring blood pressure within a normal range. Communication  Review all the medicines you take with your caregiver because there may be side effects or interactions.  Talk with your caregiver about your diet, exercise habits, and other lifestyle factors that may be contributing to high blood pressure.  See your caregiver regularly. Your caregiver can help you create and adjust your plan for managing high blood pressure. RECOMMENDATIONS FOR TREATMENT AND FOLLOW-UP  The following recommendations are based on current guidelines for managing high blood pressure in nonpregnant adults. Use these recommendations to identify the proper follow-up period or treatment option based on your blood pressure reading. You can discuss these options with your caregiver.  Systolic pressure of 120 to 139 or diastolic pressure of 80 to 89: Follow up with your caregiver as directed.  Systolic pressure of 140 to 160 or diastolic pressure of 90 to 100: Follow up with your caregiver within 2 months.  Systolic pressure above 160 or diastolic pressure above 100: Follow up with your caregiver within 1 month.  Systolic pressure above 180 or diastolic pressure above 110: Consider antihypertensive therapy;  follow up with your caregiver within 1 week.  Systolic pressure above 200 or diastolic pressure above 120: Begin antihypertensive therapy; follow up with your caregiver within 1 week. Document Released: 06/05/2012 Document Reviewed: 06/05/2012 [facility] Patient Information 2015 [place], [place]. This information is not intended to replace advice given to you by your health care provider. Make sure you discuss any questions you have with your health care provider.

## 2014-06-17 NOTE — ED Notes (Signed)
Patient transported to X-ray 

## 2014-06-17 NOTE — ED Notes (Signed)
MD at bedside. 

## 2014-06-17 NOTE — ED Notes (Signed)
Pt. reports headache onset this morning on the way to work with nausea and vomitting , denies fever or chills , no blurred vision or photophobia . Alert and oriented /respirations unlabored .

## 2014-06-17 NOTE — ED Notes (Signed)
Patient transported to CT 

## 2014-07-28 ENCOUNTER — Emergency Department (HOSPITAL_COMMUNITY)
Admission: EM | Admit: 2014-07-28 | Discharge: 2014-07-28 | Disposition: A | Discharge status: Home / Self Care | Payer: 59 | Attending: Emergency Medicine | Admitting: Emergency Medicine

## 2014-07-28 ENCOUNTER — Encounter (HOSPITAL_COMMUNITY): Payer: Self-pay

## 2014-07-28 DIAGNOSIS — Z720 Tobacco use: Secondary | ICD-10-CM | POA: Insufficient documentation

## 2014-07-28 DIAGNOSIS — I10 Essential (primary) hypertension: Secondary | ICD-10-CM | POA: Insufficient documentation

## 2014-07-28 DIAGNOSIS — R600 Localized edema: Secondary | ICD-10-CM | POA: Diagnosis not present

## 2014-07-28 DIAGNOSIS — S161XXA Strain of muscle, fascia and tendon at neck level, initial encounter: Secondary | ICD-10-CM

## 2014-07-28 DIAGNOSIS — Y9389 Activity, other specified: Secondary | ICD-10-CM | POA: Insufficient documentation

## 2014-07-28 DIAGNOSIS — Z79899 Other long term (current) drug therapy: Secondary | ICD-10-CM | POA: Diagnosis not present

## 2014-07-28 DIAGNOSIS — Y92410 Unspecified street and highway as the place of occurrence of the external cause: Secondary | ICD-10-CM | POA: Diagnosis not present

## 2014-07-28 DIAGNOSIS — Z7982 Long term (current) use of aspirin: Secondary | ICD-10-CM | POA: Insufficient documentation

## 2014-07-28 DIAGNOSIS — S199XXA Unspecified injury of neck, initial encounter: Secondary | ICD-10-CM | POA: Diagnosis present

## 2014-07-28 MED ORDER — LOSARTAN POTASSIUM-HCTZ 100-25 MG PO TABS: 1.0000 | ORAL_TABLET | Freq: Every day | ORAL | Status: DC

## 2014-07-28 MED ORDER — VALSARTAN 40 MG PO TABS: 40.0000 mg | ORAL_TABLET | Freq: Every day | ORAL | Status: DC

## 2014-07-28 MED ORDER — ACETAMINOPHEN 500 MG PO TABS: 1000.0000 mg | ORAL_TABLET | Freq: Four times a day (QID) | ORAL | Status: DC | PRN

## 2014-07-28 MED ORDER — ORPHENADRINE CITRATE ER 100 MG PO TB12: 100.0000 mg | ORAL_TABLET | Freq: Two times a day (BID) | ORAL | Status: DC

## 2014-07-28 NOTE — ED Notes (Signed)
Gave pt ice water °

## 2014-07-28 NOTE — ED Notes (Signed)
Pt ambulatory in triage. 
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
## 2014-07-28 NOTE — ED Notes (Signed)
Dr. Pfeiffer at the bedside.  
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
## 2014-07-28 NOTE — Discharge Instructions (Signed)
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
## 2014-09-21 DIAGNOSIS — Z720 Tobacco use: Secondary | ICD-10-CM | POA: Insufficient documentation

## 2015-07-06 ENCOUNTER — Encounter: Payer: 59 | Attending: Family Medicine

## 2015-07-06 VITALS — Ht 71.0 in | Wt 296.2 lb

## 2015-07-06 DIAGNOSIS — Z713 Dietary counseling and surveillance: Secondary | ICD-10-CM | POA: Diagnosis not present

## 2015-07-06 DIAGNOSIS — E118 Type 2 diabetes mellitus with unspecified complications: Secondary | ICD-10-CM | POA: Diagnosis not present

## 2015-07-09 NOTE — Progress Notes (Signed)

## 2015-07-13 DIAGNOSIS — E119 Type 2 diabetes mellitus without complications: Secondary | ICD-10-CM

## 2015-07-13 DIAGNOSIS — E118 Type 2 diabetes mellitus with unspecified complications: Secondary | ICD-10-CM | POA: Diagnosis not present

## 2015-07-14 NOTE — Progress Notes (Signed)

## 2015-07-20 ENCOUNTER — Ambulatory Visit: Payer: Managed Care, Other (non HMO)

## 2015-07-29 ENCOUNTER — Encounter: Payer: 59 | Attending: Family Medicine

## 2015-07-29 DIAGNOSIS — E119 Type 2 diabetes mellitus without complications: Secondary | ICD-10-CM

## 2015-07-29 DIAGNOSIS — E118 Type 2 diabetes mellitus with unspecified complications: Secondary | ICD-10-CM | POA: Diagnosis not present

## 2015-07-29 DIAGNOSIS — Z713 Dietary counseling and surveillance: Secondary | ICD-10-CM | POA: Diagnosis not present

## 2015-07-29 NOTE — Progress Notes (Signed)
Patient was seen on 07/29/15 for the third of a series of three diabetes self-management courses at the [facility].   Miguel Craig. State the amount of activity recommended for healthy living . Describe activities suitable for individual needs . Identify ways to regularly incorporate activity into daily life . Identify barriers to activity and ways to over come these barriers  Identify diabetes medications being personally used and their primary action for lowering glucose and possible side effects . Describe role of stress on blood glucose and develop strategies to address psychosocial issues . Identify diabetes complications and ways to prevent them  Explain how to manage diabetes during illness . Evaluate success in meeting personal goal . Establish 2-3 goals that they will plan to diligently work on until they return for the  7925-month follow-up visit  Goals:   I will be active 90 minutes or more 4 times a week  To help manage stress I will change my living conditions  Your patient has identified these potential barriers to change:  Motivation Stress  Your patient has identified their diabetes self-care support plan as  [facility]  Plan:  Attend Optional Core 4 in 4 months

## 2016-01-05 DIAGNOSIS — R748 Abnormal levels of other serum enzymes: Secondary | ICD-10-CM | POA: Insufficient documentation

## 2016-01-05 HISTORY — DX: Abnormal levels of other serum enzymes: R74.8

## 2016-09-15 ENCOUNTER — Encounter (HOSPITAL_BASED_OUTPATIENT_CLINIC_OR_DEPARTMENT_OTHER): Payer: Self-pay

## 2016-09-15 ENCOUNTER — Observation Stay (HOSPITAL_BASED_OUTPATIENT_CLINIC_OR_DEPARTMENT_OTHER): Payer: 59

## 2016-09-15 ENCOUNTER — Observation Stay (HOSPITAL_BASED_OUTPATIENT_CLINIC_OR_DEPARTMENT_OTHER)
Admission: EM | Admit: 2016-09-15 | Discharge: 2016-09-17 | Disposition: A | Discharge status: Home / Self Care | Payer: 59 | Attending: Family Medicine | Admitting: Family Medicine

## 2016-09-15 ENCOUNTER — Emergency Department (HOSPITAL_BASED_OUTPATIENT_CLINIC_OR_DEPARTMENT_OTHER): Payer: 59

## 2016-09-15 DIAGNOSIS — Z79899 Other long term (current) drug therapy: Secondary | ICD-10-CM | POA: Diagnosis not present

## 2016-09-15 DIAGNOSIS — I517 Cardiomegaly: Secondary | ICD-10-CM | POA: Diagnosis not present

## 2016-09-15 DIAGNOSIS — I4510 Unspecified right bundle-branch block: Secondary | ICD-10-CM | POA: Diagnosis not present

## 2016-09-15 DIAGNOSIS — K7581 Nonalcoholic steatohepatitis (NASH): Secondary | ICD-10-CM | POA: Diagnosis present

## 2016-09-15 DIAGNOSIS — Z9989 Dependence on other enabling machines and devices: Secondary | ICD-10-CM

## 2016-09-15 DIAGNOSIS — F17210 Nicotine dependence, cigarettes, uncomplicated: Secondary | ICD-10-CM | POA: Insufficient documentation

## 2016-09-15 DIAGNOSIS — Z8249 Family history of ischemic heart disease and other diseases of the circulatory system: Secondary | ICD-10-CM | POA: Diagnosis not present

## 2016-09-15 DIAGNOSIS — E669 Obesity, unspecified: Secondary | ICD-10-CM | POA: Insufficient documentation

## 2016-09-15 DIAGNOSIS — E119 Type 2 diabetes mellitus without complications: Secondary | ICD-10-CM

## 2016-09-15 DIAGNOSIS — R400 Somnolence: Secondary | ICD-10-CM

## 2016-09-15 DIAGNOSIS — Z833 Family history of diabetes mellitus: Secondary | ICD-10-CM | POA: Diagnosis not present

## 2016-09-15 DIAGNOSIS — I10 Essential (primary) hypertension: Secondary | ICD-10-CM | POA: Diagnosis not present

## 2016-09-15 DIAGNOSIS — Z7982 Long term (current) use of aspirin: Secondary | ICD-10-CM | POA: Diagnosis not present

## 2016-09-15 DIAGNOSIS — E785 Hyperlipidemia, unspecified: Secondary | ICD-10-CM | POA: Insufficient documentation

## 2016-09-15 DIAGNOSIS — R4182 Altered mental status, unspecified: Principal | ICD-10-CM | POA: Insufficient documentation

## 2016-09-15 DIAGNOSIS — E876 Hypokalemia: Secondary | ICD-10-CM | POA: Diagnosis present

## 2016-09-15 DIAGNOSIS — G4733 Obstructive sleep apnea (adult) (pediatric): Secondary | ICD-10-CM | POA: Diagnosis present

## 2016-09-15 DIAGNOSIS — Z7984 Long term (current) use of oral hypoglycemic drugs: Secondary | ICD-10-CM | POA: Diagnosis not present

## 2016-09-15 DIAGNOSIS — Z6841 Body Mass Index (BMI) 40.0 and over, adult: Secondary | ICD-10-CM | POA: Insufficient documentation

## 2016-09-15 HISTORY — DX: Unspecified asthma, uncomplicated: J45.909

## 2016-09-15 LAB — PROTIME-INR
INR: 1.05
Prothrombin Time: 13.7 seconds (ref 11.4–15.2)

## 2016-09-15 LAB — BASIC METABOLIC PANEL
Anion gap: 10 (ref 5–15)
BUN: 18 mg/dL (ref 6–20)
CALCIUM: 9.1 mg/dL (ref 8.9–10.3)
CO2: 26 mmol/L (ref 22–32)
CREATININE: 1.04 mg/dL (ref 0.61–1.24)
Chloride: 99 mmol/L — ABNORMAL LOW (ref 101–111)
GFR calc Af Amer: >60 mL/min (ref >60)
GFR calc non Af Amer: >60 mL/min (ref >60)
GLUCOSE: 199 mg/dL — AB (ref 65–99)
Potassium: 2.8 mmol/L — ABNORMAL LOW (ref 3.5–5.1)
Sodium: 135 mmol/L (ref 135–145)

## 2016-09-15 LAB — URINALYSIS, ROUTINE W REFLEX MICROSCOPIC
BILIRUBIN URINE: NEGATIVE
GLUCOSE, UA: NEGATIVE mg/dL
HGB URINE DIPSTICK: NEGATIVE
Ketones, ur: NEGATIVE mg/dL
Leukocytes, UA: NEGATIVE
Nitrite: NEGATIVE
PROTEIN: NEGATIVE mg/dL
Specific Gravity, Urine: 1.023 (ref 1.005–1.030)
pH: 5.5 (ref 5.0–8.0)

## 2016-09-15 LAB — CBC WITH DIFFERENTIAL/PLATELET
BASOS PCT: 0 %
Basophils Absolute: 0 10*3/uL (ref 0.0–0.1)
EOS PCT: 4 %
Eosinophils Absolute: 0.3 10*3/uL (ref 0.0–0.7)
HEMATOCRIT: 37.4 % — AB (ref 39.0–52.0)
Hemoglobin: 12.7 g/dL — ABNORMAL LOW (ref 13.0–17.0)
LYMPHS PCT: 37 %
Lymphs Abs: 2.6 10*3/uL (ref 0.7–4.0)
MCH: 30 pg (ref 26.0–34.0)
MCHC: 34 g/dL (ref 30.0–36.0)
MCV: 88.4 fL (ref 78.0–100.0)
MONO ABS: 0.4 10*3/uL (ref 0.1–1.0)
MONOS PCT: 6 %
NEUTROS ABS: 3.8 10*3/uL (ref 1.7–7.7)
Neutrophils Relative %: 53 %
PLATELETS: 255 10*3/uL (ref 150–400)
RBC: 4.23 MIL/uL (ref 4.22–5.81)
RDW: 13.3 % (ref 11.5–15.5)
WBC: 7.1 10*3/uL (ref 4.0–10.5)

## 2016-09-15 LAB — RAPID URINE DRUG SCREEN, HOSP PERFORMED
AMPHETAMINES: NOT DETECTED
BARBITURATES: NOT DETECTED
BENZODIAZEPINES: NOT DETECTED
COCAINE: NOT DETECTED
Opiates: NOT DETECTED
TETRAHYDROCANNABINOL: NOT DETECTED

## 2016-09-15 LAB — I-STAT VENOUS BLOOD GAS, ED
ACID-BASE EXCESS: 5 mmol/L — AB (ref 0.0–2.0)
BICARBONATE: 30 mmol/L — AB (ref 20.0–28.0)
O2 SAT: 88 %
TCO2: 31 mmol/L (ref 0–100)
pCO2, Ven: 43.1 mmHg — ABNORMAL LOW (ref 44.0–60.0)
pH, Ven: 7.45 — ABNORMAL HIGH (ref 7.250–7.430)
pO2, Ven: 52 mmHg — ABNORMAL HIGH (ref 32.0–45.0)

## 2016-09-15 LAB — HEPATIC FUNCTION PANEL
ALBUMIN: 3.8 g/dL (ref 3.5–5.0)
ALK PHOS: 97 U/L (ref 38–126)
ALT: 102 U/L — ABNORMAL HIGH (ref 17–63)
AST: 50 U/L — ABNORMAL HIGH (ref 15–41)
BILIRUBIN TOTAL: 0.6 mg/dL (ref 0.3–1.2)
Bilirubin, Direct: 0.1 mg/dL (ref 0.1–0.5)
Indirect Bilirubin: 0.5 mg/dL (ref 0.3–0.9)
Total Protein: 6.8 g/dL (ref 6.5–8.1)

## 2016-09-15 LAB — CBG MONITORING, ED: Glucose-Capillary: 196 mg/dL — ABNORMAL HIGH (ref 65–99)

## 2016-09-15 LAB — ETHANOL: Alcohol, Ethyl (B): <5 mg/dL (ref <5)

## 2016-09-15 LAB — APTT: aPTT: 29 seconds (ref 24–36)

## 2016-09-15 LAB — TROPONIN I: Troponin I: <0.03 ng/mL (ref <0.03)

## 2016-09-15 LAB — AMMONIA: Ammonia: 42 umol/L — ABNORMAL HIGH (ref 9–35)

## 2016-09-15 MED ORDER — NALOXONE HCL 2 MG/2ML IJ SOSY
1.0000 mg | PREFILLED_SYRINGE | Freq: Once | INTRAMUSCULAR | Status: AC
  Administered 2016-09-15: 1 mg via INTRAVENOUS
  Filled 2016-09-15: qty 2

## 2016-09-15 MED ORDER — POTASSIUM CHLORIDE CRYS ER 20 MEQ PO TBCR
40.0000 meq | EXTENDED_RELEASE_TABLET | Freq: Once | ORAL | Status: DC
  Filled 2016-09-15: qty 2

## 2016-09-15 MED ORDER — NALOXONE HCL 0.4 MG/ML IJ SOLN
0.4000 mg | Freq: Once | INTRAMUSCULAR | Status: AC
  Administered 2016-09-15: 0.4 mg via INTRAVENOUS

## 2016-09-15 MED ORDER — NALOXONE HCL 0.4 MG/ML IJ SOLN
INTRAMUSCULAR | Status: AC
  Filled 2016-09-15: qty 1

## 2016-09-15 NOTE — ED Notes (Signed)
Paged [facility] hospitalist via [facility] 11:23 pm

## 2016-09-15 NOTE — ED Notes (Signed)
Paged Neurologist @ [phone] 11:13 pm

## 2016-09-15 NOTE — ED Triage Notes (Signed)
Pt last recall was eating lunch possibly 745pm-wife states she was talking to pt on the phone at 8p when pt "didn't make sense"-pt does recall BS 138 per co-worker-wife picked pt up and brought him to ED-pt states "just feel confused"-A/0 at this time-NAD

## 2016-09-15 NOTE — ED Provider Notes (Signed)
[facility] Provider Note   CSN: [id number] Arrival date & time: 09/15/16  2045  By signing my name below, I, Miguel Craig, attest that this documentation has been prepared under the direction and in the presence of physician practitioner, Pricilla LovelessScott Cherese Lozano, MD. Electronically Signed: [name], Scribe. 09/15/2016. 9:52 PM.  History   Chief Complaint Chief Complaint  Patient presents with  . Altered Mental Status    The history is provided by the patient and the spouse. No language interpreter was used.     HPI Comments: Miguel Craig is a 40 y.o. male with PMHx significant for DM, HTN, and HLD who presents to the Emergency Department complaining of low blood sugar beginning around 8 PM tonight. He states he measured his blood sugar at 138 around this time and notes he currently feels dizzy. Wife reports pt called her from work around 8 PM and sounded fatigued and confused; she reports pt was making sense for about 5 minutes and then became completely incoherent. Wife reports pt has been forgetful tonight and does not even remember the ride here. Wife notes that pt's boss stated that pt had not been acting himself since reporting to work at 3 PM this afternoon. 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 He denies alcohol or other drug use tonight. Wife notes pt is usually very active and energetic. He denies vomiting, focal weakness, slurred speech, seizures, syncope, or any other associated symptoms.  Past Medical History:  Diagnosis Date  . Diabetes mellitus without complication (HCC)   . Hyperlipidemia   . Hypertension   . Sleep apnea     Patient Active Problem List   Diagnosis Date Noted  . Somnolence 09/15/2016    Past Surgical History:  Procedure Laterality Date  . ANKLE SURGERY         Home Medications    Prior to Admission medications   Medication Sig Start Date End Date  Taking? Authorizing Provider  acetaminophen (TYLENOL) 500 MG tablet Take 2 tablets (1,000 mg total) by mouth every 6 (six) hours as needed. 07/28/14   Arby BarretteMarcy Pfeiffer, MD  albuterol (PROVENTIL HFA;VENTOLIN HFA) 108 (90 BASE) MCG/ACT inhaler Inhale 1-2 puffs into the lungs every 6 (six) hours as needed for wheezing or shortness of breath.    Historical Provider, MD  amLODipine (NORVASC) 10 MG tablet Take 10 mg by mouth daily.    Historical Provider, MD  Aspirin-Salicylamide-Caffeine (BC HEADACHE POWDER PO) Take 1 packet by mouth daily as needed (for headache).    Historical Provider, MD  atorvastatin (LIPITOR) 40 MG tablet Take 40 mg by mouth daily.    Historical Provider, MD  Cholecalciferol (VITAMIN D) 2000 UNITS tablet Take 10,000 Units by mouth daily.    Historical Provider, MD  glipiZIDE (GLUCOTROL XL) 10 MG 24 hr tablet Take 10 mg by mouth daily with breakfast.    Historical Provider, MD  losartan-hydrochlorothiazide (HYZAAR) 100-25 MG per tablet Take 1 tablet by mouth daily. 07/28/14   Arby BarretteMarcy Pfeiffer, MD  losartan-hydrochlorothiazide (HYZAAR) 100-25 MG per tablet Take 1 tablet by mouth daily. 07/28/14   Arby BarretteMarcy Pfeiffer, MD  metFORMIN (GLUMETZA) 500 MG (MOD) 24 hr tablet Take 500 mg by mouth daily with breakfast.    Historical Provider, MD  orphenadrine (NORFLEX) 100 MG tablet Take 1 tablet (100 mg total) by mouth 2 (two) times daily. 07/28/14   Arby BarretteMarcy Pfeiffer, MD  valsartan (DIOVAN) 40 MG tablet Take  1 tablet (40 mg total) by mouth daily. 07/28/14   Arby Barrette, MD  valsartan (DIOVAN) 40 MG tablet Take 1 tablet (40 mg total) by mouth daily. 07/28/14   Arby Barrette, MD    Family History No family history on file.  Social History Social History  Substance Use Topics  . Smoking status: Current Every Day Smoker    Types: Cigarettes  . Smokeless tobacco: Never Used  . Alcohol use No     Allergies   Patient has no known allergies.   Review of Systems Review of Systems  Constitutional:  Positive for activity change and fatigue.  Gastrointestinal: Negative for vomiting.  Neurological: Positive for dizziness. Negative for seizures, syncope, speech difficulty and weakness.  Psychiatric/Behavioral: Positive for confusion.  All other systems reviewed and are negative.    Physical Exam Updated Vital Signs BP 116/72 (BP Location: Left Arm)   Pulse 68   Temp 98 F (36.7 C) (Oral)   Resp 16   Ht 5\' 10"  (1.778 m)   Wt 293 lb 9.6 oz (133.2 kg)   SpO2 98%   BMI 42.13 kg/m   Physical Exam  Constitutional: He is oriented to person, place, and time. He appears well-developed and well-nourished.  HENT:  Head: Normocephalic and atraumatic.  Right Ear: External ear normal.  Left Ear: External ear normal.  Nose: Nose normal.  Eyes: Right eye exhibits no discharge. Left eye exhibits no discharge.  Neck: Neck supple.  Cardiovascular: Normal rate, regular rhythm and normal heart sounds.   Pulmonary/Chest: Effort normal and breath sounds normal.  Abdominal: Soft. There is no tenderness.  Musculoskeletal: He exhibits no edema.  Neurological: He is alert and oriented to person, place, and time.  He is awake but very sleepy, closes eyes and seems to fall asleep quickly. Speech is not slurred but he often trails off mid-sentence. CN 3-12 grossly intact. 5/5 strength in all 4 extremities. Grossly normal sensation. Normal finger to nose.  Skin: Skin is warm and dry.  Nursing note and vitals reviewed.    ED Treatments / Results  Labs (all labs ordered are listed, but only abnormal results are displayed) Labs Reviewed  CBC WITH DIFFERENTIAL/PLATELET - Abnormal; Notable for the following:       Result Value   Hemoglobin 12.7 (*)    HCT 37.4 (*)    All other components within normal limits  BASIC METABOLIC PANEL - Abnormal; Notable for the following:    Potassium 2.8 (*)    Chloride 99 (*)    Glucose, Bld 199 (*)    All other components within normal limits  AMMONIA -  Abnormal; Notable for the following:    Ammonia 42 (*)    All other components within normal limits  HEPATIC FUNCTION PANEL - Abnormal; Notable for the following:    AST 50 (*)    ALT 102 (*)    All other components within normal limits  CBG MONITORING, ED - Abnormal; Notable for the following:    Glucose-Capillary 196 (*)    All other components within normal limits  I-STAT VENOUS BLOOD GAS, ED - Abnormal; Notable for the following:    pH, Ven 7.450 (*)    pCO2, Ven 43.1 (*)    pO2, Ven 52.0 (*)    Bicarbonate 30.0 (*)    Acid-Base Excess 5.0 (*)    All other components within normal limits  ETHANOL  PROTIME-INR  APTT  RAPID URINE DRUG SCREEN, HOSP PERFORMED  URINALYSIS, ROUTINE W  REFLEX MICROSCOPIC  TROPONIN I    EKG  EKG Interpretation  Date/Time:  Friday September 15 2016 21:11:58 EST Ventricular Rate:  65 PR Interval:    QRS Duration: 133 QT Interval:  416 QTC Calculation: 433 R Axis:   38 Text Interpretation:  Sinus rhythm IVCD, consider atypical RBBB nonspecific T waves no longer present compared to 2015 Confirmed by Trayonna Bachmeier MD, Sami Roes [phone]([id number]) on 09/15/2016 10:19:20 PM       Radiology Ct Head Wo Contrast  Result Date: 09/15/2016 CLINICAL DATA:  Confused, her wife not making sense on phone EXAM: CT HEAD WITHOUT CONTRAST TECHNIQUE: Contiguous axial images were obtained from the base of the skull through the vertex without intravenous contrast. COMPARISON:  06/17/2014 FINDINGS: Brain: No evidence of acute infarction, hemorrhage, hydrocephalus, extra-axial collection or mass lesion/mass effect. Again visualized is a partially empty sella. Vascular: No hyperdense vessel or unexpected calcification. Skull: Normal. Negative for fracture or focal lesion. Sinuses/Orbits: No acute finding. Other: None IMPRESSION: No CT evidence for acute intracranial abnormality. Electronically Signed   By: Jasmine PangKim  Fujinaga M.D.   On: 09/15/2016 22:36    Procedures Procedures (including  critical care time)  DIAGNOSTIC STUDIES: Oxygen Saturation is 98% on RA, normal by my interpretation.    COORDINATION OF CARE: 10:03 PM Discussed treatment plan with pt and his wife at bedside and they agreed to plan.  Medications Ordered in ED Medications  potassium chloride SA (K-DUR,KLOR-CON) CR tablet 40 mEq (40 mEq Oral Not Given 09/15/16 2323)  naloxone [facility](NARCAN) injection 0.4 mg (0.4 mg Intravenous Given 09/15/16 2246)  naloxone [facility](NARCAN) injection 1 mg (1 mg Intravenous Given 09/15/16 2316)     Initial Impression / Assessment and Plan / ED Course  I have reviewed the triage vital signs and the nursing notes.  Pertinent labs & imaging results that were available during my care of the patient were reviewed by me and considered in my medical decision making (see chart for details).  Clinical Course as of Sep 15 2345  Fri Sep 15, 2016  2205 Besides lethargy his neuro exam is normal. No focal signs/symptoms. While CVA is in differential this is more likely to be metabolic or drug related.  [SG]  2344 Dr. Adela Glimpseoutova to admit to tele obs, asks for CXR  [SG]    Clinical Course User Index [SG] Pricilla LovelessScott Tayllor Breitenstein, MD    No significant difference seen after trials of narcan. Does not have a classic opiate overdose picture. He can now tell me it's Friday but otherwise still quite somnolent. Discussed case with Dr. Roseanne RenoStewart of neuro who recommends MRI and observation. UDS negative, although acute intoxication/ingestion could still be contributing. Dr. Adela Glimpseoutova to admit, telemetry obs  Final Clinical Impressions(s) / ED Diagnoses   Final diagnoses:  Somnolence    New Prescriptions New Prescriptions   No medications on file   I personally performed the services described in this documentation, which was scribed in my presence. The recorded information has been reviewed and is accurate.    Pricilla LovelessScott Jarrell Armond, MD 09/15/16 [phone]

## 2016-09-15 NOTE — Plan of Care (Signed)
40 yo With altered mental status with hx  of DM, COPD Exam non focal, lethargic, given narcan without improvement ammonia 42, EtOH negative VBG 7.4 CO2 43 Spoke to neurology who recommended TIA work up. Consult neuro if positive.  No new medications  troponin neg CT head negative  Accepted to tele bed Observation Tashawnda Bleiler' 11:45 PM

## 2016-09-16 ENCOUNTER — Encounter (HOSPITAL_COMMUNITY): Payer: Self-pay | Admitting: Family Medicine

## 2016-09-16 ENCOUNTER — Observation Stay (HOSPITAL_BASED_OUTPATIENT_CLINIC_OR_DEPARTMENT_OTHER): Payer: 59

## 2016-09-16 ENCOUNTER — Observation Stay (HOSPITAL_COMMUNITY): Payer: 59

## 2016-09-16 DIAGNOSIS — R400 Somnolence: Secondary | ICD-10-CM

## 2016-09-16 DIAGNOSIS — E119 Type 2 diabetes mellitus without complications: Secondary | ICD-10-CM

## 2016-09-16 DIAGNOSIS — K7581 Nonalcoholic steatohepatitis (NASH): Secondary | ICD-10-CM | POA: Diagnosis present

## 2016-09-16 DIAGNOSIS — E876 Hypokalemia: Secondary | ICD-10-CM | POA: Diagnosis not present

## 2016-09-16 DIAGNOSIS — I10 Essential (primary) hypertension: Secondary | ICD-10-CM | POA: Diagnosis present

## 2016-09-16 HISTORY — DX: Hypokalemia: E87.6

## 2016-09-16 LAB — BASIC METABOLIC PANEL
ANION GAP: 7 (ref 5–15)
BUN: 11 mg/dL (ref 6–20)
CO2: 29 mmol/L (ref 22–32)
Calcium: 9 mg/dL (ref 8.9–10.3)
Chloride: 103 mmol/L (ref 101–111)
Creatinine, Ser: 1.06 mg/dL (ref 0.61–1.24)
GFR calc non Af Amer: >60 mL/min (ref >60)
GLUCOSE: 133 mg/dL — AB (ref 65–99)
POTASSIUM: 3.3 mmol/L — AB (ref 3.5–5.1)
SODIUM: 139 mmol/L (ref 135–145)

## 2016-09-16 LAB — LIPID PANEL
CHOL/HDL RATIO: 3.9 ratio
CHOLESTEROL: 114 mg/dL (ref 0–200)
HDL: 29 mg/dL — ABNORMAL LOW (ref >40)
LDL Cholesterol: 57 mg/dL (ref 0–99)
Triglycerides: 141 mg/dL (ref <150)
VLDL: 28 mg/dL (ref 0–40)

## 2016-09-16 LAB — GLUCOSE, CAPILLARY
GLUCOSE-CAPILLARY: 209 mg/dL — AB (ref 65–99)
GLUCOSE-CAPILLARY: 157 mg/dL — AB (ref 65–99)
Glucose-Capillary: 144 mg/dL — ABNORMAL HIGH (ref 65–99)

## 2016-09-16 LAB — MAGNESIUM: MAGNESIUM: 1.7 mg/dL (ref 1.7–2.4)

## 2016-09-16 MED ORDER — LORAZEPAM 2 MG/ML IJ SOLN
1.0000 mg | Freq: Once | INTRAMUSCULAR | Status: AC
  Administered 2016-09-16: 1 mg via INTRAVENOUS

## 2016-09-16 MED ORDER — ACETAMINOPHEN 650 MG RE SUPP: 650.0000 mg | Freq: Four times a day (QID) | RECTAL | Status: DC | PRN

## 2016-09-16 MED ORDER — INSULIN ASPART 100 UNIT/ML ~~LOC~~ SOLN
0.0000 [IU] | Freq: Four times a day (QID) | SUBCUTANEOUS | Status: DC
  Administered 2016-09-16: 3 [IU] via SUBCUTANEOUS
  Administered 2016-09-16: 5 [IU] via SUBCUTANEOUS
  Administered 2016-09-17: 2 [IU] via SUBCUTANEOUS
  Administered 2016-09-17: 5 [IU] via SUBCUTANEOUS
  Administered 2016-09-17: 2 [IU] via SUBCUTANEOUS

## 2016-09-16 MED ORDER — ONDANSETRON HCL 4 MG PO TABS: 4.0000 mg | ORAL_TABLET | Freq: Four times a day (QID) | ORAL | Status: DC | PRN

## 2016-09-16 MED ORDER — INSULIN ASPART 100 UNIT/ML ~~LOC~~ SOLN: 0.0000 [IU] | Freq: Every day | SUBCUTANEOUS | Status: DC

## 2016-09-16 MED ORDER — IRBESARTAN 300 MG PO TABS
300.0000 mg | ORAL_TABLET | Freq: Every day | ORAL | Status: DC
  Administered 2016-09-16 – 2016-09-17 (×2): 300 mg via ORAL
  Filled 2016-09-16 (×2): qty 1

## 2016-09-16 MED ORDER — AMLODIPINE BESYLATE 10 MG PO TABS
10.0000 mg | ORAL_TABLET | Freq: Every day | ORAL | Status: DC
  Administered 2016-09-16 – 2016-09-17 (×2): 10 mg via ORAL
  Filled 2016-09-16 (×2): qty 1

## 2016-09-16 MED ORDER — SODIUM CHLORIDE 0.9% FLUSH
3.0000 mL | Freq: Two times a day (BID) | INTRAVENOUS | Status: DC
  Administered 2016-09-16 (×2): 3 mL via INTRAVENOUS

## 2016-09-16 MED ORDER — ATORVASTATIN CALCIUM 40 MG PO TABS
40.0000 mg | ORAL_TABLET | Freq: Every day | ORAL | Status: DC
  Administered 2016-09-16 – 2016-09-17 (×2): 40 mg via ORAL
  Filled 2016-09-16 (×2): qty 1

## 2016-09-16 MED ORDER — ONDANSETRON HCL 4 MG/2ML IJ SOLN: 4.0000 mg | Freq: Four times a day (QID) | INTRAMUSCULAR | Status: DC | PRN

## 2016-09-16 MED ORDER — ENOXAPARIN SODIUM 40 MG/0.4ML ~~LOC~~ SOLN
40.0000 mg | SUBCUTANEOUS | Status: DC
  Administered 2016-09-16 – 2016-09-17 (×2): 40 mg via SUBCUTANEOUS
  Filled 2016-09-16 (×2): qty 0.4

## 2016-09-16 MED ORDER — INSULIN ASPART 100 UNIT/ML ~~LOC~~ SOLN: 0.0000 [IU] | Freq: Three times a day (TID) | SUBCUTANEOUS | Status: DC

## 2016-09-16 MED ORDER — POTASSIUM CHLORIDE IN NACL 40-0.9 MEQ/L-% IV SOLN
INTRAVENOUS | Status: AC
  Administered 2016-09-16: 100 mL/h via INTRAVENOUS
  Filled 2016-09-16: qty 1000

## 2016-09-16 MED ORDER — ACETAMINOPHEN 325 MG PO TABS: 650.0000 mg | ORAL_TABLET | Freq: Four times a day (QID) | ORAL | Status: DC | PRN

## 2016-09-16 NOTE — Progress Notes (Signed)
Patient more alert at change of shift than the entire shift.  Prior, patient could not  Wake up enough for me to do any assessment.

## 2016-09-16 NOTE — Progress Notes (Signed)
*  PRELIMINARY RESULTS* Vascular Ultrasound Carotid Duplex (Doppler) has been completed.  Preliminary findings: Bilateral 1-39% ICA stenosis, antegrade vertebral flow bilaterally.  Difficult exam due to patient cooperation, patient could not stay awake.   Miguel FischerCharlotte C Cydni Craig 09/16/2016, 8:59 AM

## 2016-09-16 NOTE — H&P (Signed)
History and Physical  Patient Name: Miguel Craig     ZOX:[id number]    DOB: 27-Mar-1976    DOA: 09/15/2016 PCP: Devra DoppHOWELL, TAMIEKA, MD   Patient coming from: Home --> [facility]  Chief Complaint: Confusion, somnolence  HPI: Miguel Craig is a 40 y.o. male with a past medical history significant for NIDDM, HTN, and NASH who presents with acute altered mental status.  Caveat that patient is a poor historian because of somnolence, so history is largely collected from EDP.    Per patient, he was at work today, went to lunch around 8 PM, sat on his car eating a sandwich, and then all of a sudden as he was going back to work felt dizzy, and like he had "low blood sugar" and that he had focal "tingling"on his left face. Evidently a coworker or boss thought that he was acting abnormally from the time he arrived at work actually.  The patient called his wife who stated that he sounded "fatigued" and "confused" and "completely incoherent", so she brought him to the ER.   To me he denies focal weakness, slurred speech, aphasia, ataxia, double vision, vertigo, and EMS nor EDP noted their either.  He denies taking any illicit drugs, alcohol, or drugs from his workplace (he works at [employer]).    ED course: -Afebrile, heart rate 68, respirations and pulse oximetry normal, blood pressure 116/72 -Na 135, K 2.8, Cr 1.04 (baseline 1), WBC 7.1K, Hgb 12.7 -Alcohol level negative -Troponin negative -Coags normal -CT head normal -Ammonia slightly elevated -Urinalysis unremarkable -UDS negative -The case was discussed with neurohospitalist who recommended MRI and TIA workup -He was given oral potassium and [facility] was asked to accept in transfer for altered mental status             ROS: Review of Systems  Musculoskeletal: Negative for neck pain.  Neurological: Positive for tingling. Negative for tremors, sensory change, speech change, focal weakness, seizures, loss of consciousness, weakness  and headaches.  All other systems reviewed and are negative.         Past Medical History:  Diagnosis Date  . Diabetes mellitus without complication (HCC)   . Hyperlipidemia   . Hypertension   . Sleep apnea     Past Surgical History:  Procedure Laterality Date  . ANKLE SURGERY      Social History: Patient lives with his wife.  The patient walks unassisted.  He smokes.  He works at [employer].    No Known Allergies  Family history: family history includes Diabetes in his father; Hyperlipidemia in his father; Hypertension in his mother.  Prior to Admission medications   Medication Sig Start Date End Date Taking? Authorizing Provider  acetaminophen (TYLENOL) 500 MG tablet Take 2 tablets (1,000 mg total) by mouth every 6 (six) hours as needed. 07/28/14   Arby BarretteMarcy Pfeiffer, MD  albuterol (PROVENTIL HFA;VENTOLIN HFA) 108 (90 BASE) MCG/ACT inhaler Inhale 1-2 puffs into the lungs every 6 (six) hours as needed for wheezing or shortness of breath.    Historical Provider, MD  amLODipine (NORVASC) 10 MG tablet Take 10 mg by mouth daily.    Historical Provider, MD  Aspirin-Salicylamide-Caffeine (BC HEADACHE POWDER PO) Take 1 packet by mouth daily as needed (for headache).    Historical Provider, MD  atorvastatin (LIPITOR) 40 MG tablet Take 40 mg by mouth daily.    Historical Provider, MD  Cholecalciferol (VITAMIN D) 2000 UNITS tablet Take 10,000 Units by mouth daily.  Historical Provider, MD  glipiZIDE (GLUCOTROL XL) 10 MG 24 hr tablet Take 10 mg by mouth daily with breakfast.    Historical Provider, MD  losartan-hydrochlorothiazide (HYZAAR) 100-25 MG per tablet Take 1 tablet by mouth daily. 07/28/14   Arby Barrette, MD  losartan-hydrochlorothiazide (HYZAAR) 100-25 MG per tablet Take 1 tablet by mouth daily. 07/28/14   Arby Barrette, MD  metFORMIN (GLUMETZA) 500 MG (MOD) 24 hr tablet Take 500 mg by mouth daily with breakfast.    Historical Provider, MD  orphenadrine (NORFLEX) 100 MG  tablet Take 1 tablet (100 mg total) by mouth 2 (two) times daily. 07/28/14   Arby Barrette, MD  valsartan (DIOVAN) 40 MG tablet Take 1 tablet (40 mg total) by mouth daily. 07/28/14   Arby Barrette, MD  valsartan (DIOVAN) 40 MG tablet Take 1 tablet (40 mg total) by mouth daily. 07/28/14   Arby Barrette, MD       Physical Exam: BP 105/70 (BP Location: Left Arm)   Pulse (!) 59   Temp 97.7 F (36.5 C) (Oral)   Resp 18   Ht 5\' 10"  (1.778 m)   Wt 133.2 kg (293 lb 9.6 oz)   SpO2 99%   BMI 42.13 kg/m  General appearance: Well-developed, adult male, somnolent, rousable, answers questions with eyes closed, falls back to sleep.   Eyes: Anicteric, conjunctiva inflamed, lids and lashes normal. PERRL.    ENT: No nasal deformity, discharge, epistaxis.  Hearing normal. OP moist without lesions.   Neck: No neck masses.  Trachea midline.  No thyromegaly/tenderness. Lymph: No cervical or supraclavicular lymphadenopathy. Skin: Warm and dry.  No suspicious rashes or lesions. Cardiac: RRR, nl S1-S2, no murmurs appreciated.  Capillary refill is brisk.  JVP normal.  No LE edema.  Radial pulses 2+ and symmetric. Respiratory: Normal respiratory rate and rhythm.  CTAB without rales or wheezes. Abdomen: Abdomen soft.  No TTP. No ascites, distension, hepatosplenomegaly.   MSK: No deformities or effusions.  No cyanosis or clubbing. Neuro: Face symmetric.  EOMI.  PERRL.  Face sensation equal.  Palate elevation normal.  SCM equal. Hearing normal.  Phonation normal. No nystagmus, but lag in pursuits.  FTN is abnormal, hard to tell if this is just because he is sleepy.  Sensation intact to light touch. Speech is fluent.  Muscle strength 5/5 and symmetric.  No asterixis.    Psych: oriented to [place], sleepy, responds to questions with eyes closed, falls back asleep.  Behavior odd.     Labs on Admission:  I have personally reviewed following labs and imaging studies: CBC:  Recent Labs Lab 09/15/16 2130  WBC  7.1  NEUTROABS 3.8  HGB 12.7*  HCT 37.4*  MCV 88.4  PLT 255   Basic Metabolic Panel:  Recent Labs Lab 09/15/16 2130  NA 135  K 2.8*  CL 99*  CO2 26  GLUCOSE 199*  BUN 18  CREATININE 1.04  CALCIUM 9.1   GFR: Estimated Creatinine Clearance: 131 mL/min (by C-G formula based on SCr of 1.04 mg/dL).  Liver Function Tests:  Recent Labs Lab 09/15/16 2130  AST 50*  ALT 102*  ALKPHOS 97  BILITOT 0.6  PROT 6.8  ALBUMIN 3.8   No results for input(s): LIPASE, AMYLASE in the last 168 hours.  Recent Labs Lab 09/15/16 2225  AMMONIA 42*   Coagulation Profile:  Recent Labs Lab 09/15/16 2130  INR 1.05   Cardiac Enzymes:  Recent Labs Lab 09/15/16 2130  TROPONINI <0.03   BNP (last 3  results) No results for input(s): PROBNP in the last 8760 hours. HbA1C: No results for input(s): HGBA1C in the last 72 hours. CBG:  Recent Labs Lab 09/15/16 2055  GLUCAP 196*   Lipid Profile: No results for input(s): CHOL, HDL, LDLCALC, TRIG, CHOLHDL, LDLDIRECT in the last 72 hours. Thyroid Function Tests: No results for input(s): TSH, T4TOTAL, FREET4, T3FREE, THYROIDAB in the last 72 hours. Anemia Panel: No results for input(s): VITAMINB12, FOLATE, FERRITIN, TIBC, IRON, RETICCTPCT in the last 72 hours. Sepsis Labs: Invalid input(s): PROCALCITONIN, LACTICIDVEN No results found for this or any previous visit (from the past 240 hour(s)).       Radiological Exams on Admission: Personally reviewed CT head report and personally reviewed CXR shows no airspace disease: Ct Head Wo Contrast  Result Date: 09/15/2016 CLINICAL DATA:  Confused, her wife not making sense on phone EXAM: CT HEAD WITHOUT CONTRAST TECHNIQUE: Contiguous axial images were obtained from the base of the skull through the vertex without intravenous contrast. COMPARISON:  06/17/2014 FINDINGS: Brain: No evidence of acute infarction, hemorrhage, hydrocephalus, extra-axial collection or mass lesion/mass effect. Again  visualized is a partially empty sella. Vascular: No hyperdense vessel or unexpected calcification. Skull: Normal. Negative for fracture or focal lesion. Sinuses/Orbits: No acute finding. Other: None IMPRESSION: No CT evidence for acute intracranial abnormality. Electronically Signed   By: Jasmine PangKim  Fujinaga M.D.   On: 09/15/2016 22:36   Dg Chest Port 1 View  Result Date: 09/16/2016 CLINICAL DATA:  Altered mental status at work EXAM: PORTABLE CHEST 1 VIEW COMPARISON:  06/17/2014 FINDINGS: The patient is rotated. No acute infiltrate or effusion. Mild cardiomegaly is exaggerated by rotation. No overt failure. No pneumothorax. IMPRESSION: Mild cardiomegaly which is augmented by patient rotation. No acute infiltrate or edema. Electronically Signed   By: Jasmine PangKim  Fujinaga M.D.   On: 09/16/2016 00:04    EKG: Independently reviewed. Rate 65, QTc normal, RBBB.    Assessment/Plan  1. Somnolence:  Unclear cause.  Appears to be a toxidrome, although what is not clear.  Neuro suspects possible TIA.   BP normal.  Ammonia minimally elevated, but nonspecific.   No headache or neck pain or fever. -MRI/MRA brain ordered -Neuro checks -Monitor on telemetry -Carotid US and echocardiogram ordered  -IVF and NPO until mental status clears (SLP ordered per protocol, although if he wakes up, perhaps this is not necessary)     2. HTN:  -Continue amlodipine and valsartan -Hold chlorthalidone for K  3. NIDDM:  -Hold metformin and glipizide -SSI q6hrs for now  4. NASH:  Had RUQ US and hepatitis serologies with PCP earlier this year.  LFTs unchanged.  5. Hypokalemia: -Repleted -Magnesium level and repeat BMP ordered -Stop chlorthalidone       DVT prophylaxis: Lovenox  Code Status: FULL  Family Communication: None present  Disposition Plan: Anticipate monitoring, MRI brain, correct hypokalemia, and monitor. Consults called: Neuro Admission status: OBS At the point of initial evaluation, it is my  clinical opinion that admission for OBSERVATION is reasonable and necessary because the patient's presenting complaints in the context of their chronic conditions represent sufficient risk of deterioration or significant morbidity to constitute reasonable grounds for close observation in the hospital setting, but that the patient may be medically stable for discharge from the hospital within 24 to 48 hours.    Medical decision making: Patient seen at 2:46 AM on 09/16/2016.  What exists of the patient's chart was reviewed in depth and summarized above.  Clinical condition: stable.  Edwin Dada [facility] Pager [phone]

## 2016-09-16 NOTE — Evaluation (Signed)
Clinical/Bedside Swallow Evaluation Patient Details  Name: Miguel Craig MRN: [id number] Date of Birth: 1976-02-15  Today's Date: 09/16/2016 Time: SLP Start Time (ACUTE ONLY): 1100 SLP Stop Time (ACUTE ONLY): 1116 SLP Time Calculation (min) (ACUTE ONLY): 16 min  Past Medical History:  Past Medical History:  Diagnosis Date  . Asthma   . Diabetes mellitus without complication (HCC)   . Hyperlipidemia   . Hypertension   . Sleep apnea    Past Surgical History:  Past Surgical History:  Procedure Laterality Date  . ANKLE SURGERY     HPI:  40 y.o. male with a past medical history significant for NIDDM, HTN, and NASH who presents with acute altered mental status; 09/16/16 MRI head revealed Motion degraded examination without evidence of acute intracranial abnormality.   Assessment / Plan / Recommendation Clinical Impression  Pt with no overt s/s of aspiration noted; [facility] consistencies; Regular (carb modified) diet recommended with thin liquids; meds whole with liquids; no f/u with ST indicated at this time.    Aspiration Risk  No limitations    Diet Recommendation   Regular (carb modified)/thin  Medication Administration: Whole meds with liquid    Other  Recommendations Oral Care Recommendations: Oral care BID   Follow up Recommendations None      Frequency and Duration   N/A         Prognosis Prognosis for Safe Diet Advancement: Good      Swallow Study   General Date of Onset: 09/15/16 HPI: 40 y.o. male with a past medical history significant for NIDDM, HTN, and NASH who presents with acute altered mental status. Type of Study: Bedside Swallow Evaluation Previous Swallow Assessment: NSSS; failed Diet Prior to this Study: NPO Temperature Spikes Noted: No Respiratory Status: Room air History of Recent Intubation: No Behavior/Cognition: Alert;Cooperative;Pleasant mood Oral Cavity Assessment: Within Functional Limits Oral Care Completed by  SLP: No Oral Cavity - Dentition: Adequate natural dentition Vision: Functional for self-feeding Self-Feeding Abilities: Able to feed self Patient Positioning: Upright in bed Baseline Vocal Quality: Normal Volitional Cough: Strong Volitional Swallow: Able to elicit    Oral/Motor/Sensory Function Overall Oral Motor/Sensory Function: Within functional limits   Ice Chips Ice chips: Within functional limits Presentation: Self Fed   Thin Liquid Thin Liquid: Within functional limits Presentation: Cup;Straw    Nectar Thick Nectar Thick Liquid: Not tested   Honey Thick Honey Thick Liquid: Not tested   Puree Puree: Within functional limits Presentation: Self Fed   Solid      Solid: Within functional limits Presentation: Self Fed    Functional Assessment Tool Used: NOMS Functional Limitations: Swallowing Swallow Current Status (X5284(G8996): 0 percent impaired, limited or restricted Swallow Goal Status (X3244(G8997): 0 percent impaired, limited or restricted Swallow Discharge Status (W1027(G8998): 0 percent impaired, limited or restricted   Miguel Craig,PAT, M.S., CCC-SLP 09/16/2016,11:41 AM

## 2016-09-16 NOTE — Progress Notes (Signed)
Patient arrived to room, via stretcher, with [facility].  Patient is very drowsy.  Skin intact. IV in his R hand. Arm band was verified by 2 nurses.  Placed on telemetry, box 27, called CCMD, and was Verified by myself and the [facility] nurse [place].

## 2016-09-16 NOTE — Progress Notes (Signed)
Attempted to wake patient to do assessments.  Unable to . He is asleep and cannot stay awake long enough to do NIH, neuro checks. Etc.  Will continue to monitor.

## 2016-09-16 NOTE — Progress Notes (Signed)
Interval note   Patient admitted for hypoglycemia with altered mentation. Patient back to baseline mentally.  TIA/Stroke work up so far negative   Symptoms likely related to hypoglycemia exacerbating OSA and sleep deprivation  ECHO pending - if normal result will d/c tomorrow AM  Patient use to be on CPAP machine at home but not using  Patient may need repeat sleeps studies as outpatient  K repleted  For full details - see H&P  Patient admitted after 12 AM   Latrelle DodrillEdwin Silva, MD  [facility]

## 2016-09-16 NOTE — Progress Notes (Signed)
Pt has no IV access. IV meds have been DCd and pt pulled out IV thinking he didn't need it.

## 2016-09-17 ENCOUNTER — Observation Stay (HOSPITAL_BASED_OUTPATIENT_CLINIC_OR_DEPARTMENT_OTHER): Payer: 59

## 2016-09-17 DIAGNOSIS — G4733 Obstructive sleep apnea (adult) (pediatric): Secondary | ICD-10-CM

## 2016-09-17 DIAGNOSIS — E119 Type 2 diabetes mellitus without complications: Secondary | ICD-10-CM

## 2016-09-17 DIAGNOSIS — G459 Transient cerebral ischemic attack, unspecified: Secondary | ICD-10-CM | POA: Diagnosis not present

## 2016-09-17 DIAGNOSIS — E876 Hypokalemia: Secondary | ICD-10-CM | POA: Diagnosis not present

## 2016-09-17 DIAGNOSIS — I10 Essential (primary) hypertension: Secondary | ICD-10-CM

## 2016-09-17 DIAGNOSIS — R400 Somnolence: Secondary | ICD-10-CM | POA: Diagnosis not present

## 2016-09-17 LAB — CBC
HCT: 37.8 % — ABNORMAL LOW (ref 39.0–52.0)
Hemoglobin: 12.2 g/dL — ABNORMAL LOW (ref 13.0–17.0)
MCH: 29.3 pg (ref 26.0–34.0)
MCHC: 32.3 g/dL (ref 30.0–36.0)
MCV: 90.6 fL (ref 78.0–100.0)
PLATELETS: 215 10*3/uL (ref 150–400)
RBC: 4.17 MIL/uL — ABNORMAL LOW (ref 4.22–5.81)
RDW: 14.2 % (ref 11.5–15.5)
WBC: 5.5 10*3/uL (ref 4.0–10.5)

## 2016-09-17 LAB — BASIC METABOLIC PANEL
Anion gap: 7 (ref 5–15)
BUN: 14 mg/dL (ref 6–20)
CALCIUM: 9 mg/dL (ref 8.9–10.3)
CO2: 30 mmol/L (ref 22–32)
CREATININE: 1.07 mg/dL (ref 0.61–1.24)
Chloride: 103 mmol/L (ref 101–111)
GFR calc non Af Amer: >60 mL/min (ref >60)
GLUCOSE: 173 mg/dL — AB (ref 65–99)
Potassium: 3.1 mmol/L — ABNORMAL LOW (ref 3.5–5.1)
Sodium: 140 mmol/L (ref 135–145)

## 2016-09-17 LAB — HEMOGLOBIN A1C
HEMOGLOBIN A1C: 11.6 % — AB (ref 4.8–5.6)
Mean Plasma Glucose: 286 mg/dL

## 2016-09-17 LAB — ECHOCARDIOGRAM COMPLETE
HEIGHTINCHES: 70 in
WEIGHTICAEL: 4588.8 [oz_av]

## 2016-09-17 LAB — GLUCOSE, CAPILLARY
GLUCOSE-CAPILLARY: 234 mg/dL — AB (ref 65–99)
Glucose-Capillary: 140 mg/dL — ABNORMAL HIGH (ref 65–99)
Glucose-Capillary: 140 mg/dL — ABNORMAL HIGH (ref 65–99)

## 2016-09-17 MED ORDER — POTASSIUM CHLORIDE CRYS ER 20 MEQ PO TBCR: 40.0000 meq | EXTENDED_RELEASE_TABLET | Freq: Two times a day (BID) | ORAL | 0 refills | Status: DC

## 2016-09-17 NOTE — Discharge Summary (Signed)
Physician Discharge Summary  Miguel Craig  WUJ:[id number]  DOB: 22-Oct-1975  DOA: 09/15/2016 PCP: Devra Dopp, MD  Admit date: 09/15/2016 Discharge date: 09/17/2016  Admitted From: Home Disposition: Home  Recommendations for Outpatient Follow-up:  1. Follow up with PCP in 1-2 weeks 2. Please obtain BMP/CBC in one week  Discharge Condition: Stable  CODE STATUS: FULL  Diet recommendation: Heart Healthy / Carb Modified   Brief/Interim Summary: 40 y/o M with PMHx of DM type 2, OSA and NASH presented to the ED with AMS. Patient report having dizziness, jittery and tingling in his face, "felt like when he have hypoglycemia" Subsequently patient became fatigue, confuse an incoherent and he was brought to the ED  In ED he was work up for stroke, CT head,Neurolgy was consulted and recommended to admit the patient for TIA work up  MRI/MRA essentially negative. In the ED patient was somnolent. ECHO shows mild LVH with EF 55% no MW abnormalities. Patient was back to baseline the morning after the admission.   Discharge Diagnoses/Hospital Course:  AMS - likely secondary to hypoglycemia and sleep deprivation from OSA and burnout - Resolved In ED Narcan was given with no improvement  Patient work night shift and has OSA, was prescribed a CPAP but currently no using it. Patient report that he fall asleep easy  MRI/MRA negative  CT Head normal ECHO mild LVH with EF 55% no MW abnormalities. CBG's stable during hospital stay  Carotid doppler with mild stenosis in the left and right internal carotid artery 1-39% Placed on CPAP - patient felt much better and report feeling much rested  Recommended to follow up with PCP for outpatient sleep studies   HTN - stable  Resume home meds  Hold chlorthalidone   DM type 2 uncontrolled - A1C 11.6 - ?  Initial Hypoglycemia  Resume home medications - metformin and glipizide May need insulin - to discuss with PCP  Check FS goal < 140 fasting   NASH -  LFTs stable   Hypokalemia - likely due to chlorthalidone  K repleted Kdur 40 mEq x 2 days  Check BMP in 1 week with PCP   OSA Sleep studies for CPAP  While in hospital did well with CPAP   Discharge Instructions  Discharge Instructions    Call MD for:  difficulty breathing, headache or visual disturbances    Complete by:  As directed    Call MD for:  extreme fatigue    Complete by:  As directed    Call MD for:  hives    Complete by:  As directed    Call MD for:  persistant dizziness or light-headedness    Complete by:  As directed    Call MD for:  persistant nausea and vomiting    Complete by:  As directed    Call MD for:  redness, tenderness, or signs of infection (pain, swelling, redness, odor or green/yellow discharge around incision site)    Complete by:  As directed    Call MD for:  severe uncontrolled pain    Complete by:  As directed    Call MD for:  temperature >100.4    Complete by:  As directed    Diet - low sodium heart healthy    Complete by:  As directed    Discharge instructions    Complete by:  As directed    Increase activity slowly    Complete by:  As directed      Allergies as of 09/17/2016  Reactions   Hydrocodone-acetaminophen Nausea Only      Medication List    TAKE these medications   acetaminophen 500 MG tablet Commonly known as:  TYLENOL Take 2 tablets (1,000 mg total) by mouth every 6 (six) hours as needed.   albuterol 108 (90 Base) MCG/ACT inhaler Commonly known as:  PROVENTIL HFA;VENTOLIN HFA Inhale 1-2 puffs into the lungs every 6 (six) hours as needed for wheezing or shortness of breath.   amLODipine 10 MG tablet Commonly known as:  NORVASC Take 10 mg by mouth daily.   atorvastatin 40 MG tablet Commonly known as:  LIPITOR Take 40 mg by mouth daily.   BC HEADACHE POWDER PO Take 1 packet by mouth daily as needed (for headache).   glipiZIDE 10 MG 24 hr tablet Commonly known as:  GLUCOTROL XL Take 10 mg by mouth daily  with breakfast.   metFORMIN 500 MG (MOD) 24 hr tablet Commonly known as:  GLUMETZA Take 500 mg by mouth daily with breakfast.   potassium chloride SA 20 MEQ tablet Commonly known as:  K-DUR,KLOR-CON Take 2 tablets (40 mEq total) by mouth 2 (two) times daily.   valsartan 320 MG tablet Commonly known as:  DIOVAN Take 320 mg by mouth daily.   Vitamin D 2000 units tablet Take 10,000 Units by mouth daily.      Follow-up Information    Devra Dopp, MD. Schedule an appointment as soon as possible for a visit in 1 week(s).   Specialty:  Family Medicine Contact information: [address] [place] [address] [phone]          Allergies  Allergen Reactions  . Hydrocodone-Acetaminophen Nausea Only    Consultations:  Neurology   Procedures/Studies: Ct Head Wo Contrast  Result Date: 09/15/2016 CLINICAL DATA:  Confused, her wife not making sense on phone EXAM: CT HEAD WITHOUT CONTRAST TECHNIQUE: Contiguous axial images were obtained from the base of the skull through the vertex without intravenous contrast. COMPARISON:  06/17/2014 FINDINGS: Brain: No evidence of acute infarction, hemorrhage, hydrocephalus, extra-axial collection or mass lesion/mass effect. Again visualized is a partially empty sella. Vascular: No hyperdense vessel or unexpected calcification. Skull: Normal. Negative for fracture or focal lesion. Sinuses/Orbits: No acute finding. Other: None IMPRESSION: No CT evidence for acute intracranial abnormality. Electronically Signed   By: Jasmine Pang M.D.   On: 09/15/2016 22:36   Mr Brain Wo Contrast  Result Date: 09/16/2016 CLINICAL DATA:  Altered mental status. EXAM: MRI HEAD WITHOUT CONTRAST MRA HEAD WITHOUT CONTRAST TECHNIQUE: Multiplanar, multiecho pulse sequences of the brain and surrounding structures were obtained without intravenous contrast. Angiographic images of the head were obtained using MRA technique without contrast.  COMPARISON:  Head CT 09/15/2016 FINDINGS: MRI HEAD FINDINGS Multiple sequences are mildly to moderately motion degraded. Brain: There is no evidence of acute infarct, intracranial hemorrhage, mass, midline shift, or extra-axial fluid collection. The ventricles and sulci are normal in size. A cavum septum pellucidum et vergae is incidentally noted. The brain is normal in signal within limitations of motion artifact. Partially empty sella. Vascular: Major intracranial vascular flow voids are preserved. Skull and upper cervical spine: Unremarkable bone marrow signal. Sinuses/Orbits: Unremarkable orbits. Paranasal sinuses and mastoid air cells are clear. Other: None. MRA HEAD FINDINGS The study is mildly motion degraded. The visualized distal vertebral arteries are patent to the basilar. Left PICA is patent. Right PICA is not identified, nor are AICAs. SCA origins are patent. Basilar artery is widely patent. Posterior communicating  arteries are diminutive or absent. PCAs are patent without evidence of significant stenosis. The internal carotid arteries are widely patent from skullbase to carotid termini. ACAs and MCAs are patent without evidence of major branch occlusion or significant stenosis. No intracranial aneurysm is identified. IMPRESSION: 1. Motion degraded examination without evidence of acute intracranial abnormality. 2. Partially empty sella, often an incidental finding though can be seen in the setting of idiopathic intracranial hypertension. 3. Negative head MRA. Electronically Signed   By: Sebastian AcheAllen  Grady M.D.   On: 09/16/2016 10:45   Dg Chest Port 1 View  Result Date: 09/16/2016 CLINICAL DATA:  Altered mental status at work EXAM: PORTABLE CHEST 1 VIEW COMPARISON:  06/17/2014 FINDINGS: The patient is rotated. No acute infiltrate or effusion. Mild cardiomegaly is exaggerated by rotation. No overt failure. No pneumothorax. IMPRESSION: Mild cardiomegaly which is augmented by patient rotation. No acute  infiltrate or edema. Electronically Signed   By: Jasmine PangKim  Fujinaga M.D.   On: 09/16/2016 00:04   Mr Maxine GlennMra Headm  Result Date: 09/16/2016 CLINICAL DATA:  Altered mental status. EXAM: MRI HEAD WITHOUT CONTRAST MRA HEAD WITHOUT CONTRAST TECHNIQUE: Multiplanar, multiecho pulse sequences of the brain and surrounding structures were obtained without intravenous contrast. Angiographic images of the head were obtained using MRA technique without contrast. COMPARISON:  Head CT 09/15/2016 FINDINGS: MRI HEAD FINDINGS Multiple sequences are mildly to moderately motion degraded. Brain: There is no evidence of acute infarct, intracranial hemorrhage, mass, midline shift, or extra-axial fluid collection. The ventricles and sulci are normal in size. A cavum septum pellucidum et vergae is incidentally noted. The brain is normal in signal within limitations of motion artifact. Partially empty sella. Vascular: Major intracranial vascular flow voids are preserved. Skull and upper cervical spine: Unremarkable bone marrow signal. Sinuses/Orbits: Unremarkable orbits. Paranasal sinuses and mastoid air cells are clear. Other: None. MRA HEAD FINDINGS The study is mildly motion degraded. The visualized distal vertebral arteries are patent to the basilar. Left PICA is patent. Right PICA is not identified, nor are AICAs. SCA origins are patent. Basilar artery is widely patent. Posterior communicating arteries are diminutive or absent. PCAs are patent without evidence of significant stenosis. The internal carotid arteries are widely patent from skullbase to carotid termini. ACAs and MCAs are patent without evidence of major branch occlusion or significant stenosis. No intracranial aneurysm is identified. IMPRESSION: 1. Motion degraded examination without evidence of acute intracranial abnormality. 2. Partially empty sella, often an incidental finding though can be seen in the setting of idiopathic intracranial hypertension. 3. Negative head MRA.  Electronically Signed   By: Sebastian AcheAllen  Grady M.D.   On: 09/16/2016 10:45     Carotid Doppler 12/23 ------------------------------------------------------------------- Summary: Findings consistent with a 1-39 percent stenosis involving the right internal carotid artery and the left internal carotid artery. Distal internal carotid artery not well seen due to body habitus and patient cooperation. Bilateral vertebral arteries appear patent and antegrade.  ECHO 12/24 ------------------------------------------------------------------- Study Conclusions  - Left ventricle: The cavity size was normal. Wall thickness was   increased in a pattern of mild LVH. Systolic function was normal.   The estimated ejection fraction was 55%. Wall motion was normal;   there were no regional wall motion abnormalities. Left   ventricular diastolic function parameters were normal. - Right atrium: Central venous pressure (est): 3 mm Hg. - Atrial septum: No defect or patent foramen ovale was identified. - Tricuspid valve: There was trivial regurgitation. - Pulmonary arteries: PA peak pressure: 27 mm Hg (S). -  Pericardium, extracardiac: There was no pericardial effusion.  Impressions:  - Mild LVH with LVEF approximately 55% and normal diastolic   function. Trivial tricuspid regurgitation with PASP 27 mmHg . No   obvious PFO or ASD.   Discharge Exam: Vitals:   09/17/16 0523 09/17/16 1214  BP: 107/74 121/82  Pulse: 64 64  Resp: 18 18  Temp: 97.9 F (36.6 C) 98.6 F (37 C)   Vitals:   09/16/16 1414 09/16/16 2155 09/17/16 0523 09/17/16 1214  BP: 112/75 (!) 103/54 107/74 121/82  Pulse: 63 63 64 64  Resp: 19 18 18 18   Temp: 97.3 F (36.3 C) 98.5 F (36.9 C) 97.9 F (36.6 C) 98.6 F (37 C)  TempSrc: Oral Oral Oral   SpO2: 97% 90% 97% 100%  Weight:      Height:        General: Pt is alert, awake, not in acute distress Cardiovascular: RRR, S1/S2 +, no rubs, no gallops Respiratory: CTA  bilaterally, no wheezing, no rhonchi Abdominal:  Obese Soft, NT, ND, bowel sounds + Extremities: no edema, no cyanosis   The results of significant diagnostics from this hospitalization (including imaging, microbiology, ancillary and laboratory) are listed below for reference.     Microbiology: No results found for this or any previous visit (from the past 240 hour(s)).   Labs: BNP (last 3 results) No results for input(s): BNP in the last 8760 hours. Basic Metabolic Panel:  Recent Labs Lab 09/15/16 2130 09/16/16 0422 09/16/16 1459 09/17/16 0404  NA 135  --  139 140  K 2.8*  --  3.3* 3.1*  CL 99*  --  103 103  CO2 26  --  29 30  GLUCOSE 199*  --  133* 173*  BUN 18  --  11 14  CREATININE 1.04  --  1.06 1.07  CALCIUM 9.1  --  9.0 9.0  MG  --  1.7  --   --    Liver Function Tests:  Recent Labs Lab 09/15/16 2130  AST 50*  ALT 102*  ALKPHOS 97  BILITOT 0.6  PROT 6.8  ALBUMIN 3.8   No results for input(s): LIPASE, AMYLASE in the last 168 hours.  Recent Labs Lab 09/15/16 2225  AMMONIA 42*   CBC:  Recent Labs Lab 09/15/16 2130 09/17/16 0404  WBC 7.1 5.5  NEUTROABS 3.8  --   HGB 12.7* 12.2*  HCT 37.4* 37.8*  MCV 88.4 90.6  PLT 255 215   Cardiac Enzymes:  Recent Labs Lab 09/15/16 2130  TROPONINI <0.03   BNP: Invalid input(s): POCBNP CBG:  Recent Labs Lab 09/16/16 1138 09/16/16 1645 09/17/16 0019 09/17/16 0540 09/17/16 1212  GLUCAP 209* 157* 234* 140* 140*   D-Dimer No results for input(s): DDIMER in the last 72 hours. Hgb A1c  Recent Labs  09/16/16 0422  HGBA1C 11.6*   Lipid Profile  Recent Labs  09/16/16 0422  CHOL 114  HDL 29*  LDLCALC 57  TRIG 161  CHOLHDL 3.9   Thyroid function studies No results for input(s): TSH, T4TOTAL, T3FREE, THYROIDAB in the last 72 hours.  Invalid input(s): FREET3 Anemia work up No results for input(s): VITAMINB12, FOLATE, FERRITIN, TIBC, IRON, RETICCTPCT in the last 72 hours. Urinalysis     Component Value Date/Time   COLORURINE YELLOW 09/15/2016 2310   APPEARANCEUR CLEAR 09/15/2016 2310   LABSPEC 1.023 09/15/2016 2310   PHURINE 5.5 09/15/2016 2310   GLUCOSEU NEGATIVE 09/15/2016 2310   HGBUR NEGATIVE 09/15/2016 2310   BILIRUBINUR  NEGATIVE 09/15/2016 2310   KETONESUR NEGATIVE 09/15/2016 2310   PROTEINUR NEGATIVE 09/15/2016 2310   UROBILINOGEN 0.2 08/15/2008 2339   NITRITE NEGATIVE 09/15/2016 2310   LEUKOCYTESUR NEGATIVE 09/15/2016 2310   Sepsis Labs Invalid input(s): PROCALCITONIN,  WBC,  LACTICIDVEN Microbiology No results found for this or any previous visit (from the past 240 hour(s)).   Time coordinating discharge: Over 30 minutes  SIGNED:  Latrelle DodrillEdwin Silva, MD  [facility] 09/17/2016, 1:57 PM Pager   If 7PM-7AM, please contact night-coverage [email or web address] Password [email or web address]

## 2016-09-17 NOTE — Progress Notes (Signed)
NURSING PROGRESS NOTE  Miguel LatusSherwood Craig [id number] Discharge Data: 09/17/2016 2:22 PM Attending Provider: Lenox PondsEdwin Silva Zapata, MD WUJ:[id number]:HOWELL, Rozanna BoxAMIEKA, MD   Miguel LatusSherwood Cada to be D/C'd Home per MD order.    All IV's will be discontinued and monitored for bleeding.  All belongings will be returned to patient for patient to take home.  Last Documented Vital Signs:  Blood pressure 121/82, pulse 64, temperature 98.6 F (37 C), resp. rate 18, height 5\' 10"  (1.778 m), weight 130.1 kg (286 lb 12.8 oz), SpO2 100 %.  Madelin RearLonnie Weslie Pretlow, MSN, RN, [employer] EnergyCMSRN

## 2016-09-17 NOTE — Progress Notes (Signed)
  Echocardiogram 2D Echocardiogram has been performed.  Nolon RodBrown, Tony 09/17/2016, 12:49 PM

## 2016-09-17 NOTE — Progress Notes (Signed)
[facility]   Patient seen and examined have no new complaints  Feeling well - work up for AMS negative Will be discharge home today   Hypoglycemia with OSA   For details see Discharge summary  Latrelle DodrillEdwin Silva, MD

## 2016-09-18 LAB — VAS US CAROTID
LCCADDIAS: -25 cm/s
LEFT ECA DIAS: -16 cm/s
LEFT VERTEBRAL DIAS: -16 cm/s
LICADSYS: -75 cm/s
Left CCA dist sys: -109 cm/s
Left CCA prox dias: 23 cm/s
Left CCA prox sys: 111 cm/s
Left ICA dist dias: -30 cm/s
Left ICA prox dias: -26 cm/s
Left ICA prox sys: -94 cm/s
RCCADSYS: -54 cm/s
RIGHT ECA DIAS: -19 cm/s
RIGHT VERTEBRAL DIAS: -11 cm/s
Right CCA prox dias: 20 cm/s
Right CCA prox sys: 114 cm/s

## 2016-09-19 DIAGNOSIS — R400 Somnolence: Secondary | ICD-10-CM | POA: Diagnosis not present

## 2016-09-19 DIAGNOSIS — I10 Essential (primary) hypertension: Secondary | ICD-10-CM | POA: Diagnosis not present

## 2016-09-19 DIAGNOSIS — Z7982 Long term (current) use of aspirin: Secondary | ICD-10-CM | POA: Diagnosis not present

## 2016-09-19 DIAGNOSIS — J45909 Unspecified asthma, uncomplicated: Secondary | ICD-10-CM | POA: Insufficient documentation

## 2016-09-19 DIAGNOSIS — F17210 Nicotine dependence, cigarettes, uncomplicated: Secondary | ICD-10-CM | POA: Diagnosis not present

## 2016-09-19 DIAGNOSIS — R4182 Altered mental status, unspecified: Secondary | ICD-10-CM | POA: Diagnosis present

## 2016-09-19 DIAGNOSIS — Z7984 Long term (current) use of oral hypoglycemic drugs: Secondary | ICD-10-CM | POA: Insufficient documentation

## 2016-09-19 DIAGNOSIS — G4733 Obstructive sleep apnea (adult) (pediatric): Secondary | ICD-10-CM | POA: Diagnosis not present

## 2016-09-19 DIAGNOSIS — Z79899 Other long term (current) drug therapy: Secondary | ICD-10-CM | POA: Insufficient documentation

## 2016-09-19 LAB — CBC
HCT: 39.1 % (ref 39.0–52.0)
Hemoglobin: 13 g/dL (ref 13.0–17.0)
MCH: 29.3 pg (ref 26.0–34.0)
MCHC: 33.2 g/dL (ref 30.0–36.0)
MCV: 88.3 fL (ref 78.0–100.0)
Platelets: 270 10*3/uL (ref 150–400)
RBC: 4.43 MIL/uL (ref 4.22–5.81)
RDW: 13.8 % (ref 11.5–15.5)
WBC: 7 10*3/uL (ref 4.0–10.5)

## 2016-09-19 LAB — COMPREHENSIVE METABOLIC PANEL
ALT: 113 U/L — AB (ref 17–63)
AST: 60 U/L — AB (ref 15–41)
Albumin: 3.8 g/dL (ref 3.5–5.0)
Alkaline Phosphatase: 98 U/L (ref 38–126)
Anion gap: 11 (ref 5–15)
BILIRUBIN TOTAL: 0.4 mg/dL (ref 0.3–1.2)
BUN: 13 mg/dL (ref 6–20)
CHLORIDE: 104 mmol/L (ref 101–111)
CO2: 26 mmol/L (ref 22–32)
CREATININE: 1.09 mg/dL (ref 0.61–1.24)
Calcium: 9.6 mg/dL (ref 8.9–10.3)
GFR calc Af Amer: >60 mL/min (ref >60)
Glucose, Bld: 133 mg/dL — ABNORMAL HIGH (ref 65–99)
Potassium: 3.6 mmol/L (ref 3.5–5.1)
Sodium: 141 mmol/L (ref 135–145)
TOTAL PROTEIN: 6.7 g/dL (ref 6.5–8.1)

## 2016-09-19 LAB — CBG MONITORING, ED: Glucose-Capillary: 134 mg/dL — ABNORMAL HIGH (ref 65–99)

## 2016-09-19 NOTE — ED Triage Notes (Signed)
The pt is here with talking out of his head all day.  His family has brought him to get him checked he is a diabetic and he reports that he has been feeling different since  He was placed on a new med.  He was yawning during triage

## 2016-09-19 NOTE — ED Triage Notes (Signed)
No answer when called 

## 2016-09-20 ENCOUNTER — Emergency Department (HOSPITAL_COMMUNITY)
Admission: EM | Admit: 2016-09-20 | Discharge: 2016-09-20 | Disposition: A | Discharge status: Home / Self Care | Payer: 59 | Attending: Emergency Medicine | Admitting: Emergency Medicine

## 2016-09-20 DIAGNOSIS — R400 Somnolence: Secondary | ICD-10-CM

## 2016-09-20 DIAGNOSIS — G4733 Obstructive sleep apnea (adult) (pediatric): Secondary | ICD-10-CM

## 2016-09-20 LAB — URINALYSIS, ROUTINE W REFLEX MICROSCOPIC
Bilirubin Urine: NEGATIVE
GLUCOSE, UA: NEGATIVE mg/dL
Hgb urine dipstick: NEGATIVE
Ketones, ur: NEGATIVE mg/dL
LEUKOCYTES UA: NEGATIVE
Nitrite: NEGATIVE
PROTEIN: NEGATIVE mg/dL
Specific Gravity, Urine: 1.024 (ref 1.005–1.030)
pH: 5 (ref 5.0–8.0)

## 2016-09-20 LAB — I-STAT ARTERIAL BLOOD GAS, ED
Acid-Base Excess: 3 mmol/L — ABNORMAL HIGH (ref 0.0–2.0)
BICARBONATE: 28 mmol/L (ref 20.0–28.0)
O2 SAT: 94 %
PCO2 ART: 45.6 mmHg (ref 32.0–48.0)
PO2 ART: 71 mmHg — AB (ref 83.0–108.0)
Patient temperature: 98.6
TCO2: 29 mmol/L (ref 0–100)
pH, Arterial: 7.396 (ref 7.350–7.450)

## 2016-09-20 LAB — RAPID URINE DRUG SCREEN, HOSP PERFORMED
AMPHETAMINES: NOT DETECTED
BENZODIAZEPINES: NOT DETECTED
Barbiturates: NOT DETECTED
COCAINE: NOT DETECTED
OPIATES: NOT DETECTED
Tetrahydrocannabinol: NOT DETECTED

## 2016-09-20 LAB — CBG MONITORING, ED: GLUCOSE-CAPILLARY: 118 mg/dL — AB (ref 65–99)

## 2016-09-20 LAB — AMMONIA: AMMONIA: 39 umol/L — AB (ref 9–35)

## 2016-09-20 NOTE — ED Provider Notes (Signed)
[facility] DEPT Provider Note   CSN: [id number] Arrival date & time: 09/19/16  1956   By signing my name below, I, Miguel Craig, attest that this documentation has been prepared under the direction and in the presence of Miguel Creasehristopher J Pollina, MD. Electronically signed, Miguel Craig, ED Scribe. 09/20/16. 4:42 AM.   History   Chief Complaint Chief Complaint  Patient presents with  . Altered Mental Status   The history is provided by the patient and medical records. No language interpreter was used.    HPI Comments: Miguel Craig is a 40 y.o. male who presents to the Emergency Department with concerns for lab levels. Pt notes his blood sugar and potassium levels are low. He further reports confusion. He states he had the latter checked when [facility]. States he has not been feeling well since taking prescribed narcan. Pt denies pain.  Past Medical History:  Diagnosis Date  . Asthma   . Diabetes mellitus without complication (HCC)   . Hyperlipidemia   . Hypertension   . Sleep apnea     Patient Active Problem List   Diagnosis Date Noted  . Essential hypertension 09/16/2016  . Type 2 diabetes mellitus without complication, without long-term current use of insulin (HCC) 09/16/2016  . NASH (nonalcoholic steatohepatitis) 09/16/2016  . Hypokalemia 09/16/2016  . Somnolence 09/15/2016    Past Surgical History:  Procedure Laterality Date  . ANKLE SURGERY         Home Medications    Prior to Admission medications   Medication Sig Start Date End Date Taking? Authorizing Provider  acetaminophen (TYLENOL) 500 MG tablet Take 2 tablets (1,000 mg total) by mouth every 6 (six) hours as needed. 07/28/14  Yes Miguel BarretteMarcy Pfeiffer, MD  albuterol (PROVENTIL HFA;VENTOLIN HFA) 108 (90 BASE) MCG/ACT inhaler Inhale 1-2 puffs into the lungs every 6 (six) hours as needed for wheezing or shortness of breath.   Yes Historical Provider, MD    amLODipine (NORVASC) 10 MG tablet Take 10 mg by mouth daily.   Yes Historical Provider, MD  Aspirin-Salicylamide-Caffeine (BC HEADACHE POWDER PO) Take 1 packet by mouth daily as needed (for headache).   Yes Historical Provider, MD  atorvastatin (LIPITOR) 40 MG tablet Take 40 mg by mouth daily.   Yes Historical Provider, MD  Cholecalciferol (VITAMIN D) 2000 UNITS tablet Take 10,000 Units by mouth daily.   Yes Historical Provider, MD  glipiZIDE (GLUCOTROL XL) 10 MG 24 hr tablet Take 10 mg by mouth daily with breakfast.   Yes Historical Provider, MD  metFORMIN (GLUMETZA) 500 MG (MOD) 24 hr tablet Take 500 mg by mouth daily with breakfast.   Yes Historical Provider, MD  valsartan (DIOVAN) 320 MG tablet Take 320 mg by mouth daily. 07/16/16  Yes Historical Provider, MD  potassium chloride SA (K-DUR,KLOR-CON) 20 MEQ tablet Take 2 tablets (40 mEq total) by mouth 2 (two) times daily. 09/17/16 09/19/16  Miguel PondsEdwin Silva Zapata, MD    Family History Family History  Problem Relation Age of Onset  . Hypertension Mother   . Diabetes Father   . Hyperlipidemia Father     Social History Social History  Substance Use Topics  . Smoking status: Current Every Day Smoker    Types: Cigarettes  . Smokeless tobacco: Never Used  . Alcohol use No     Allergies   Narcan [naloxone]; Hydrocodone-acetaminophen; and Penicillins   Review of Systems Review of Systems  All other systems reviewed and are negative.  Physical Exam Updated Vital Signs BP 121/82   Pulse (!) 57   Temp 98.6 F (37 C) (Oral)   Resp 18   SpO2 100%   Physical Exam  Constitutional: He is oriented to person, place, and time. He appears well-developed and well-nourished. No distress.  HENT:  Head: Normocephalic and atraumatic.  Right Ear: Hearing normal.  Left Ear: Hearing normal.  Nose: Nose normal.  Mouth/Throat: Oropharynx is clear and moist and mucous membranes are normal.  Eyes: Conjunctivae and EOM are normal. Pupils are  equal, round, and reactive to light.  Neck: Normal range of motion. Neck supple.  Cardiovascular: Regular rhythm, S1 normal and S2 normal.  Exam reveals no gallop and no friction rub.   No murmur heard. Pulmonary/Chest: Effort normal and breath sounds normal. No respiratory distress. He exhibits no tenderness.  Abdominal: Soft. Normal appearance and bowel sounds are normal. There is no hepatosplenomegaly. There is no tenderness. There is no rebound, no guarding, no tenderness at McBurney's point and negative Murphy's sign. No hernia.  Musculoskeletal: Normal range of motion.  Neurological: He is alert and oriented to person, place, and time. He has normal strength. No cranial nerve deficit or sensory deficit. Coordination normal. GCS eye subscore is 4. GCS verbal subscore is 5. GCS motor subscore is 6.  Somnolent. Awakens to voice. Oriented x 3.  Skin: Skin is warm, dry and intact. No rash noted. No cyanosis.  Psychiatric: He has a normal mood and affect. His speech is normal and behavior is normal. Thought content normal.  Nursing note and vitals reviewed.    ED Treatments / Results  DIAGNOSTIC STUDIES: Oxygen Saturation is 100% on RA, normal by my interpretation.    COORDINATION OF CARE: 4:42 AM Discussed treatment plan with pt at bedside and pt agreed to plan.  Labs (all labs ordered are listed, but only abnormal results are displayed) Labs Reviewed  COMPREHENSIVE METABOLIC PANEL - Abnormal; Notable for the following:       Result Value   Glucose, Bld 133 (*)    AST 60 (*)    ALT 113 (*)    All other components within normal limits  AMMONIA - Abnormal; Notable for the following:    Ammonia 39 (*)    All other components within normal limits  CBG MONITORING, ED - Abnormal; Notable for the following:    Glucose-Capillary 134 (*)    All other components within normal limits  CBG MONITORING, ED - Abnormal; Notable for the following:    Glucose-Capillary 118 (*)    All other  components within normal limits  I-STAT ARTERIAL BLOOD GAS, ED - Abnormal; Notable for the following:    pO2, Arterial 71.0 (*)    Acid-Base Excess 3.0 (*)    All other components within normal limits  CBC  URINALYSIS, ROUTINE W REFLEX MICROSCOPIC  RAPID URINE DRUG SCREEN, HOSP PERFORMED    EKG  EKG Interpretation None       Radiology No results found.  Procedures Procedures (including critical care time)  Medications Ordered in ED Medications - No data to display   Initial Impression / Assessment and Plan / ED Course  I have reviewed the triage vital signs and the nursing notes.  Pertinent labs & imaging results that were available during my care of the patient were reviewed by me and considered in my medical decision making (see chart for details).  Clinical Course    Patient presents to the emergency department for evaluation of somnolence.  Patient was hospitalized from December 22 2 December 24 for same. He had thorough workup including TIA/CVA workup which was negative. It was concluded that the patient was experiencing hypersomnolence secondary to his obstructive sleep apnea. Patient has been noncompliant with the machine because he does not like to fullface mask. Patient also works shift work, 6 shifts at a time. He is having difficulty adjusting to the night shifts, likely adding to his difficulties.  He does have a history of [place]. No significant elevation of ammonia. No significant CO2 retention or hypoxia on blood gas. Patient reports that the CPAP that he had in the hospital this past week which will be covered his nose, was more convenient for him. We will provide him with this type of mask and have him use his CPAP at night, follow-up with pulmonology and primary care.    Final Clinical Impressions(s) / ED Diagnoses   Final diagnoses:  Somnolence  OSA (obstructive sleep apnea)    New Prescriptions New Prescriptions   No medications on file  I personally  performed the services described in this documentation, which was scribed in my presence. The recorded information has been reviewed and is accurate.    Miguel Creasehristopher J Pollina, MD 09/20/16 [phone]

## 2016-10-05 ENCOUNTER — Institutional Professional Consult (permissible substitution): Payer: Managed Care, Other (non HMO) | Admitting: Pulmonary Disease

## 2016-10-05 ENCOUNTER — Ambulatory Visit (INDEPENDENT_AMBULATORY_CARE_PROVIDER_SITE_OTHER): Payer: 59 | Admitting: Pulmonary Disease

## 2016-10-05 ENCOUNTER — Encounter: Payer: Self-pay | Admitting: Pulmonary Disease

## 2016-10-05 VITALS — BP 118/80 | HR 76 | Ht 70.0 in | Wt 287.2 lb

## 2016-10-05 DIAGNOSIS — Z9989 Dependence on other enabling machines and devices: Secondary | ICD-10-CM

## 2016-10-05 DIAGNOSIS — G4733 Obstructive sleep apnea (adult) (pediatric): Secondary | ICD-10-CM

## 2016-10-05 NOTE — Progress Notes (Signed)
Subjective:    Patient ID: Miguel Craig, male    DOB: August 15, 1976, 41 y.o.   MRN: [id number]  HPI  Chief Complaint  Patient presents with  . Sleep Consult    Was told to come here for a sleep consult from the hospital.      41 year old diabetic, hypertensive presents for evaluation of sleep-disordered breathing. He works swing shifts for [employer] is a new employment for him in the last 6 months. He worked the night shift and felt like he was hypoglycemic, by the time he reached the emergency room he was found to be with altered mental status-given Narcan without any relief, was admitted for TIA workup, MRI was negative, echo was normal. It was felt that his hypersomnolence was related to untreated obstructive sleep apnea. He reports a polysomnogram in 2008 that showed severe OSA and was placed on CPAP? Settings with a nasal mask. He is to machine initially for a while but over the past year had stopped using. He got back on his machine over the past few weeks. He had another ED visit on 12/27 when his father took him there-this time apneas were witnessed by the ED physician, on both admits he felt improved with CPAP therapy. ABG was 7.39/46/71 on room air suggesting mild compensated hypercarbia  He states that he does not know his DME and he does not have supplies for his machine. Suspects that the DME may have close down.  Epworth sleepiness score is 24 and a reports excessive sleepiness daily.  Bedtime is at different times since he works a swing shifts, when he works first shift he goes to bed around 9 PM, sleep latency is minimal, sleeps on his side or his back with 2 pillows and is up by 4:30 AM feeling tired with occasional dryness of mouth but denies headaches. There is no history suggestive of cataplexy, sleep paralysis or parasomnias He denies excessive caffeinated beverages   Past Medical History:  Diagnosis Date  . Asthma   . Diabetes mellitus without  complication (HCC)   . Hyperlipidemia   . Hypertension   . Sleep apnea      Past Surgical History:  Procedure Laterality Date  . ANKLE SURGERY        Allergies  Allergen Reactions  . Narcan [Naloxone] Other (See Comments)    Lethargic, sleepy, disoriented, altered mental status  . Hydrocodone-Acetaminophen Nausea Only  . Penicillins Other (See Comments)    Social History   Social History  . Marital status: Single    Spouse name: N/A  . Number of children: N/A  . Years of education: N/A   Occupational History  . Not on file.   Social History Main Topics  . Smoking status: Current Every Day Smoker    Types: Cigarettes  . Smokeless tobacco: Never Used  . Alcohol use No  . Drug use: No  . Sexual activity: Not on file   Other Topics Concern  . Not on file   Social History Narrative  . No narrative on file    Family History  Problem Relation Age of Onset  . Hypertension Mother   . Diabetes Father   . Hyperlipidemia Father      Review of Systems Constitutional: negative for anorexia, fevers and sweats  Eyes: negative for irritation, redness and visual disturbance  Ears, nose, mouth, throat, and face: negative for earaches, epistaxis, nasal congestion and sore throat  Respiratory: negative for cough, dyspnea on exertion, sputum and  wheezing  Cardiovascular: negative for chest pain, dyspnea, lower extremity edema, orthopnea, palpitations and syncope  Gastrointestinal: negative for abdominal pain, constipation, diarrhea, melena, nausea and vomiting  Genitourinary:negative for dysuria, frequency and hematuria  Hematologic/lymphatic: negative for bleeding, easy bruising and lymphadenopathy  Musculoskeletal:negative for arthralgias, muscle weakness and stiff joints  Neurological: negative for coordination problems, gait problems, headaches and weakness  Endocrine: negative for diabetic symptoms including polydipsia, polyuria and weight loss     Objective:    Physical Exam  Gen. Pleasant, obese, in no distress, normal affect ENT - no lesions, no post nasal drip, class 2 airway Neck: No JVD, no thyromegaly, no carotid bruits Lungs: no use of accessory muscles, no dullness to percussion, decreased without rales or rhonchi  Cardiovascular: Rhythm regular, heart sounds  normal, no murmurs or gallops, no peripheral edema Abdomen: soft and non-tender, no hepatosplenomegaly, BS normal. Musculoskeletal: No deformities, no cyanosis or clubbing Neuro:  alert, non focal, no tremors       Assessment & Plan:

## 2016-10-05 NOTE — Assessment & Plan Note (Addendum)
His hypersomnolence seems to be very sick we are and has required 2 ED visits  We will set you up with a new DME company Obtain download on your CPAP machine and provide new CPAP supplies  Schedule sleep study -we will do this in split format and correct sleep-disordered breathing with CPAP. If hypersomnolence still persists on follow-up study then he may need MSLT to check for narcolepsy  Weight loss encouraged, compliance with goal of at least 4-6 hrs every night is the expectation. Advised against medications with sedative side effects Cautioned against driving when sleepy - understanding that sleepiness will vary on a day to day basis

## 2016-10-05 NOTE — Patient Instructions (Signed)
We will set you up with a new DME company Obtain download on your CPAP machine and provide new CPAP supplies  Schedule sleep study

## 2016-10-06 NOTE — Addendum Note (Signed)
Addended by: Maurene CapesPOTTS, Terrah Decoster M on: 10/06/2016 03:57 PM   Modules accepted: Orders

## 2016-10-09 ENCOUNTER — Ambulatory Visit (HOSPITAL_BASED_OUTPATIENT_CLINIC_OR_DEPARTMENT_OTHER): Payer: 59 | Attending: Pulmonary Disease | Admitting: Pulmonary Disease

## 2016-10-09 DIAGNOSIS — Z9989 Dependence on other enabling machines and devices: Secondary | ICD-10-CM

## 2016-10-09 DIAGNOSIS — G4733 Obstructive sleep apnea (adult) (pediatric): Secondary | ICD-10-CM | POA: Insufficient documentation

## 2016-10-16 ENCOUNTER — Telehealth: Payer: Self-pay | Admitting: Pulmonary Disease

## 2016-10-16 DIAGNOSIS — G4733 Obstructive sleep apnea (adult) (pediatric): Secondary | ICD-10-CM

## 2016-10-16 NOTE — Telephone Encounter (Signed)
Spoke with pt, who is requesting sleep study results from 10/09/16.  RA please advise. Thanks.

## 2016-10-18 DIAGNOSIS — G4730 Sleep apnea, unspecified: Secondary | ICD-10-CM | POA: Diagnosis not present

## 2016-10-18 NOTE — Telephone Encounter (Signed)
PSG showed mod OSA - stopped breathing 21/h Corrected by CPAP 17 cm Ensure that he is back on CPAP - pls end copy of study to DME & need download on his machine ASAP to check settings He will qualify for new machine if needed

## 2016-10-18 NOTE — Telephone Encounter (Signed)
Pt aware of results and states his previous DME company is no longer open. Pt has requested a new machine, as his current machine is about 40 years old. Order has been placed. Pt aware to contact our office after cpap set to scheduled a f/u 31-90 after. Pt voiced his understanding and had no further questions.

## 2016-10-18 NOTE — Procedures (Signed)
Patient Name: Miguel Craig, Miguel Craig Date: 10/09/2016 Gender: Male D.O.B: [birth year] Age (years): 40 Referring Provider: Kara Mead MD, ABSM Height (inches): 70 Interpreting Physician: Kara Mead MD, ABSM Weight (lbs): 287 RPSGT: Carolin Coy BMI: 41 MRN: [id number] Neck Size: 18.00   CLINICAL INFORMATION Sleep Study Type: Split Night CPAP  Indication for sleep study: Diabetes, Excessive Daytime Sleepiness, Fatigue, Hypertension, Morning Headaches, Obesity, OSA, Snoring  Epworth Sleepiness Score: 22  SLEEP STUDY TECHNIQUE As per the AASM Manual for the Scoring of Sleep and Associated Events v2.3 (April 2016) with a hypopnea requiring 4% desaturations.  The channels recorded and monitored were frontal, central and occipital EEG, electrooculogram (EOG), submentalis EMG (chin), nasal and oral airflow, thoracic and abdominal wall motion, anterior tibialis EMG, snore microphone, electrocardiogram, and pulse oximetry. Continuous positive airway pressure (CPAP) was initiated when the patient met split night criteria and was titrated according to treat sleep-disordered breathing.  RESPIRATORY PARAMETERS Diagnostic  Total AHI (/hr): 21.5 RDI (/hr): 26.5 OA Index (/hr): 6.5 CA Index (/hr): 0.0 REM AHI (/hr): 69.0 NREM AHI (/hr): 16.3 Supine AHI (/hr): 21.5 Non-supine AHI (/hr): N/A Min O2 Sat (%): 79.00 Mean O2 (%): 93.91 Time below 88% (min): 3.1   Titration  Optimal Pressure (cm): 17 AHI at Optimal Pressure (/hr): 0.0 Min O2 at Optimal Pressure (%): 95.0 Supine % at Optimal (%): 100 Sleep % at Optimal (%): 99     SLEEP ARCHITECTURE The recording time for the entire night was 401.0 minutes.  During a baseline period of 218.5 minutes, the patient slept for 203.5 minutes in REM and nonREM, yielding a sleep efficiency of 93.1%. Sleep onset after lights out was 9.3 minutes with a REM latency of 80.5 minutes. The patient spent 17.20% of the night in stage N1 sleep, 72.97% in stage N2  sleep, 0.00% in stage N3 and 9.83% in REM.  During the titration period of 180.8 minutes, the patient slept for 173.0 minutes in REM and nonREM, yielding a sleep efficiency of 95.7%. Sleep onset after CPAP initiation was 6.0 minutes with a REM latency of 43.5 minutes. The patient spent 4.62% of the night in stage N1 sleep, 57.51% in stage N2 sleep, 0.00% in stage N3 and 37.86% in REM.  CARDIAC DATA The 2 lead EKG demonstrated sinus rhythm. The mean heart rate was 72.17 beats per minute. Other EKG findings include: PVCs.  LEG MOVEMENT DATA The total Periodic Limb Movements of Sleep (PLMS) were 0. The PLMS index was 0.00 .  IMPRESSIONS - Moderate obstructive sleep apnea occurred during the diagnostic portion of the study(AHI = 21.5/hour). An optimal PAP pressure was selected for this patient ( 17 cm of water) - No significant central sleep apnea occurred during the diagnostic portion of the study (CAI = 0.0/hour). - Mild oxygen desaturation was noted during the diagnostic portion of the study (Min O2 = 79.00%). - The patient snored with Loud snoring volume during the diagnostic portion of the study. - EKG findings include PVCs. - Clinically significant periodic limb movements did not occur during sleep.   DIAGNOSIS - Obstructive Sleep Apnea (327.23 [G47.33 ICD-10])   RECOMMENDATIONS - Trial of CPAP therapy on 17 cm H2O with a Medium size Fisher[employer]kel Full Face Mask Simplus mask and heated humidification. - Avoid alcohol, sedatives and other CNS depressants that may worsen sleep apnea and disrupt normal sleep architecture. - Sleep hygiene should be reviewed to assess factors that may improve sleep quality. - Weight management and regular exercise should be initiated or continued. -  Return to [facility] Center for re-evaluation after 4 weeks of therapy    Kara Mead MD Board Certified in [place]

## 2016-10-19 ENCOUNTER — Telehealth: Payer: Self-pay | Admitting: Pulmonary Disease

## 2016-10-19 NOTE — Telephone Encounter (Signed)
Per Dr. Vassie LollAlva, the 16cm setting is adequate. Usage is poor. He needs to use the machine at least 6hrs a night.   Will place results in the scan folder.

## 2016-10-19 NOTE — Telephone Encounter (Signed)
Spoke with pt, she just wants a copy of his sleep report, I advised him it is a preliminary report and his doctor has not reviewed it yet. Pt verbalized understanding so I will leave it up front.

## 2016-10-20 NOTE — Telephone Encounter (Signed)
Patient call back and I informed him of results. Patient stated he will call back once he has the machine for his 6wk follow up. Patient also stated that he will increase his usage.

## 2016-10-20 NOTE — Telephone Encounter (Signed)
Left message for him to call back.  

## 2016-10-25 ENCOUNTER — Encounter: Payer: Self-pay | Admitting: Pulmonary Disease

## 2016-10-31 ENCOUNTER — Telehealth: Payer: Self-pay | Admitting: Pulmonary Disease

## 2016-10-31 NOTE — Telephone Encounter (Signed)
lmtcb X1 for pt  

## 2016-10-31 NOTE — Telephone Encounter (Signed)
Spoke with pt. States that he was in the hospital back in December. He went awhile with out oxygen to his brain and has been having issues with his memory ever since. While in the hospital he was diagnosed with OSA. States that he started his CPAP machine 4 days ago. Pt wants to be written out on short term disability due to his memory issues. He states that he saw a Neurologist and was cleared to work. Pt kept stating that, "I need time off from work to get my mind right." Advised him again that our office would not the office to have fill out short term disability paperwork for memory issues. He verbalized understanding. Nothing further was needed.

## 2016-11-02 ENCOUNTER — Telehealth: Payer: Self-pay | Admitting: Pulmonary Disease

## 2016-11-02 NOTE — Telephone Encounter (Signed)
Patient is aware that this is not the office for his paperwork to be completed since it is for memory loss. Will shred paperwork.

## 2016-11-07 ENCOUNTER — Ambulatory Visit: Payer: 59 | Admitting: Neurology

## 2016-11-07 ENCOUNTER — Telehealth: Payer: Self-pay

## 2016-11-07 NOTE — Telephone Encounter (Signed)
Dr. Frances FurbishAthar asked me to call Dr. Bing NeighborsHowell's office and advise them that the referral sent to us on this pt will be best sent to a university setting and there is no further testing we can do at this office for the pt. Dr. Providence LaniusHowell should make a referral for this pt to go to a university setting. Dr. Frances FurbishAthar said that if Dr. Providence LaniusHowell is agreeable to this, then I should call the pt and explain it to him.  I called Dr. Bing NeighborsHowell's office, spoke to a staff member, who will discuss this with Dr. Providence LaniusHowell and call us back.

## 2016-11-07 NOTE — Telephone Encounter (Signed)
Pt called me, stating that Dr. Bing NeighborsHowell's office called him and [facility] are [facility]. I asked him if he would still like to be seen today by Dr. Frances FurbishAthar, but pt declined, saying that he is agreeable to a [facility] referral. He said that needs disability paperwork filled out, and I asked him to speak with Dr. Providence LaniusHowell, or the physician who currently is writing his disability paperwork. Pt verbalized understanding. I asked pt to call us back if he has any further questions/concerns.

## 2016-11-08 DIAGNOSIS — R410 Disorientation, unspecified: Secondary | ICD-10-CM

## 2016-11-08 DIAGNOSIS — R413 Other amnesia: Secondary | ICD-10-CM

## 2016-11-08 HISTORY — DX: Disorientation, unspecified: R41.0

## 2016-11-08 HISTORY — DX: Other amnesia: R41.3

## 2016-11-29 ENCOUNTER — Ambulatory Visit (INDEPENDENT_AMBULATORY_CARE_PROVIDER_SITE_OTHER): Payer: 59 | Admitting: Adult Health

## 2016-11-29 ENCOUNTER — Encounter: Payer: Self-pay | Admitting: Adult Health

## 2016-11-29 DIAGNOSIS — Z9989 Dependence on other enabling machines and devices: Secondary | ICD-10-CM | POA: Diagnosis not present

## 2016-11-29 DIAGNOSIS — G4733 Obstructive sleep apnea (adult) (pediatric): Secondary | ICD-10-CM

## 2016-11-29 NOTE — Assessment & Plan Note (Signed)
Good control on CPAP  Plan  Patient Instructions  Continue C Pap at bedtime Wears C Pap at least 4 hours each night. Continued work on weight loss Do not drive a sleepy Follow-up with Dr. Vassie LollAlva in 4-6 months and as needed

## 2016-11-29 NOTE — Progress Notes (Signed)
@Patient  ID: Miguel Craig, male    DOB: Aug 29, 1976, 41 y.o.   MRN: [id number]  Chief Complaint  Patient presents with  . Follow-up    OSA     Referring provider: Devra Dopp, MD  HPI: 41 year old male followed for obstructive sleep apnea on nocturnal C Pap.  TEST  Sleep study 09/2016 >21.5. Optimal control at 17cmH20.   11/29/2016 Follow up ; OSA  Patient returns for a two-month follow-up. He was seen last visit for sleep consult and set up for a sleep study that showed moderate sleep apnea. He was started on nocturnal C Pap. Patient says he's doing okay on his C Pap. He does feel a lot of better. Fernande Boyden shows excellent compliance with average usage at around 6 hours. He is on a set pressure is 17 cm H2O. AHI 0.3. Minimum leaks. Works swing shift.   Allergies  Allergen Reactions  . Narcan [Naloxone] Other (See Comments)    Lethargic, sleepy, disoriented, altered mental status  . Hydrocodone-Acetaminophen Nausea Only  . Penicillins Other (See Comments)    Immunization History  Administered Date(s) Administered  . Influenza Split 06/25/2014  . Influenza, Seasonal, Injecte, Preservative Fre 06/30/2015  . Influenza,inj,quad, With Preservative 07/14/2016  . Pneumococcal Polysaccharide-23 10/09/2014  . Tdap 02/08/2015    Past Medical History:  Diagnosis Date  . Asthma   . Diabetes mellitus without complication (HCC)   . Hyperlipidemia   . Hypertension   . Sleep apnea     Tobacco History: History  Smoking Status  . Current Every Day Smoker  . Types: Cigarettes  Smokeless Tobacco  . Never Used   Ready to quit: No Counseling given: Yes   Outpatient Encounter Prescriptions as of 11/29/2016  Medication Sig  . amLODipine (NORVASC) 10 MG tablet Take 10 mg by mouth daily.  Marland Kitchen atorvastatin (LIPITOR) 40 MG tablet Take 40 mg by mouth daily.  Marland Kitchen glipiZIDE (GLUCOTROL XL) 10 MG 24 hr tablet Take 10 mg by mouth daily with breakfast.  . metFORMIN (GLUMETZA) 500 MG  (MOD) 24 hr tablet Take 500 mg by mouth daily with breakfast.  . valsartan (DIOVAN) 320 MG tablet Take 320 mg by mouth daily.  . [DISCONTINUED] acetaminophen (TYLENOL) 500 MG tablet Take 2 tablets (1,000 mg total) by mouth every 6 (six) hours as needed.  . [DISCONTINUED] albuterol (PROVENTIL HFA;VENTOLIN HFA) 108 (90 BASE) MCG/ACT inhaler Inhale 1-2 puffs into the lungs every 6 (six) hours as needed for wheezing or shortness of breath.  . [DISCONTINUED] Aspirin-Salicylamide-Caffeine (BC HEADACHE POWDER PO) Take 1 packet by mouth daily as needed (for headache).  . [DISCONTINUED] Cholecalciferol (VITAMIN D) 2000 UNITS tablet Take 10,000 Units by mouth daily.  . potassium chloride SA (K-DUR,KLOR-CON) 20 MEQ tablet Take 2 tablets (40 mEq total) by mouth 2 (two) times daily.   No facility-administered encounter medications on file as of 11/29/2016.      Review of Systems  Constitutional:   No  weight loss, night sweats,  Fevers, chills, fatigue, or  lassitude.  HEENT:   No headaches,  Difficulty swallowing,  Tooth/dental problems, or  Sore throat,                No sneezing, itching, ear ache, nasal congestion, post nasal drip,   CV:  No chest pain,  Orthopnea, PND, swelling in lower extremities, anasarca, dizziness, palpitations, syncope.   GI  No heartburn, indigestion, abdominal pain, nausea, vomiting, diarrhea, change in bowel habits, loss of appetite, bloody stools.  Resp: No shortness of breath with exertion or at rest.  No excess mucus, no productive cough,  No non-productive cough,  No coughing up of blood.  No change in color of mucus.  No wheezing.  No chest wall deformity  Skin: no rash or lesions.  GU: no dysuria, change in color of urine, no urgency or frequency.  No flank pain, no hematuria   MS:  No joint pain or swelling.  No decreased range of motion.  No back pain.    Physical Exam  BP 130/80 (BP Location: Left Arm, Cuff Size: Large)   Pulse 84   Ht 5\' 10"  (1.778 m)    Wt 299 lb 12.8 oz (136 kg)   SpO2 99%   BMI 43.02 kg/m   GEN: A/Ox3; pleasant , NAD, obese    HEENT:  [place]/AT,  EACs-clear, TMs-wnl, NOSE-clear, THROAT-clear, no lesions, no postnasal drip or exudate noted. Class 2-3 MP airway   NECK:  Supple w/ fair ROM; no JVD; normal carotid impulses w/o bruits; no thyromegaly or nodules palpated; no lymphadenopathy.    RESP  Clear  P & A; w/o, wheezes/ rales/ or rhonchi. no accessory muscle use, no dullness to percussion  CARD:  RRR, no m/r/g, tr peripheral edema, pulses intact, no cyanosis or clubbing.  GI:   Soft & nt; nml bowel sounds; no organomegaly or masses detected.   Musco: Warm bil, no deformities or joint swelling noted.   Neuro: alert, no focal deficits noted.    Skin: Warm, no lesions or rashes  Psych:  No change in mood or affect. No depression or anxiety.  No memory loss.  Lab Results:  CBC  BNP No results found for: BNP  ProBNP No results found for: PROBNP  Imaging: No results found.   Assessment & Plan:   No problem-specific Assessment & Plan notes found for this encounter.     Rubye Oaksammy Dacia Capers, NP 11/29/2016

## 2016-11-29 NOTE — Patient Instructions (Signed)
Continue C Pap at bedtime Wears C Pap at least 4 hours each night. Continued work on weight loss Do not drive a sleepy Follow-up with Dr. Vassie LollAlva in 4-6 months and as needed

## 2016-12-04 NOTE — Progress Notes (Signed)
Reviewed & agree with plan  

## 2016-12-12 ENCOUNTER — Ambulatory Visit: Payer: 59 | Admitting: Pulmonary Disease

## 2017-04-04 ENCOUNTER — Ambulatory Visit: Payer: 59 | Admitting: Pulmonary Disease

## 2017-08-14 DIAGNOSIS — E1169 Type 2 diabetes mellitus with other specified complication: Secondary | ICD-10-CM | POA: Insufficient documentation

## 2018-08-26 DIAGNOSIS — L720 Epidermal cyst: Secondary | ICD-10-CM | POA: Insufficient documentation

## 2018-08-26 HISTORY — DX: Epidermal cyst: L72.0

## 2018-09-02 ENCOUNTER — Other Ambulatory Visit: Payer: Self-pay

## 2018-09-02 ENCOUNTER — Encounter (HOSPITAL_BASED_OUTPATIENT_CLINIC_OR_DEPARTMENT_OTHER): Payer: Self-pay | Admitting: *Deleted

## 2018-09-03 ENCOUNTER — Encounter (HOSPITAL_BASED_OUTPATIENT_CLINIC_OR_DEPARTMENT_OTHER)
Admission: RE | Admit: 2018-09-03 | Discharge: 2018-09-03 | Disposition: A | Discharge status: Home / Self Care | Payer: 59 | Source: Ambulatory Visit | Attending: Otolaryngology | Admitting: Otolaryngology

## 2018-09-03 DIAGNOSIS — Z01810 Encounter for preprocedural cardiovascular examination: Secondary | ICD-10-CM | POA: Diagnosis present

## 2018-09-03 LAB — BASIC METABOLIC PANEL
Anion gap: 13 (ref 5–15)
BUN: 17 mg/dL (ref 6–20)
CHLORIDE: 93 mmol/L — AB (ref 98–111)
CO2: 26 mmol/L (ref 22–32)
CREATININE: 1.11 mg/dL (ref 0.61–1.24)
Calcium: 9.5 mg/dL (ref 8.9–10.3)
GFR calc Af Amer: >60 mL/min (ref >60)
GFR calc non Af Amer: >60 mL/min (ref >60)
Glucose, Bld: 469 mg/dL — ABNORMAL HIGH (ref 70–99)
Potassium: 3.7 mmol/L (ref 3.5–5.1)
Sodium: 132 mmol/L — ABNORMAL LOW (ref 135–145)

## 2018-09-03 NOTE — H&P (Signed)
HPI:   Miguel Craig is a 42 y.o. male who presents as a consult Patient.   Referring Provider: Mallie Darting, MD  Chief complaint: Infected cyst left ear.  HPI: He has had epidermoid cyst removed on both sides of his face in the past. A few weeks ago a new one showed up in the left posterior earlobe area and it ruptured and drained over the weekend. Is feeling better now. No other concerns.  PMH/Meds/All/SocHx/FamHx/ROS:   Past Medical History:  Diagnosis Date  . Diabetes mellitus (HCC)  . High cholesterol  . Hypertension  . Neuropathy   Past Surgical History:  Procedure Laterality Date  . excision of ear cyst  . FOOT SURGERY  . FOOT SURGERY  . MOUTH SURGERY   No family history of bleeding disorders, wound healing problems or difficulty with anesthesia.   Social History   Socioeconomic History  . Marital status: Divorced  Spouse name: Not on file  . Number of children: Not on file  . Years of education: Not on file  . Highest education level: Not on file  Occupational History  . Not on file  Social Needs  . Financial resource strain: Not on file  . Food insecurity:  Worry: Not on file  Inability: Not on file  . Transportation needs:  Medical: Not on file  Non-medical: Not on file  Tobacco Use  . Smoking status: Former Smoker  Packs/day: 0.00  Last attempt to quit: 10/2016  Years since quitting: 1.8  . Smokeless tobacco: Never Used  Substance and Sexual Activity  . Alcohol use: No  . Drug use: No  . Sexual activity: Not on file  Lifestyle  . Physical activity:  Days per week: Not on file  Minutes per session: Not on file  . Stress: Not on file  Relationships  . Social connections:  Talks on phone: Not on file  Gets together: Not on file  Attends religious service: Not on file  Active member of club or organization: Not on file  Attends meetings of clubs or organizations: Not on file  Relationship status: Not on file  Other Topics Concern   . Not on file  Social History Narrative  . Not on file   Current Outpatient Medications:  . amLODIPine (NORVASC) 10 MG tablet, , Disp: , Rfl:  . atorvastatin (LIPITOR) 40 MG tablet, , Disp: , Rfl:  . chlorthalidone (HYGROTON) 25 MG tablet, , Disp: , Rfl:  . glipiZIDE XL (GLUCOTROL XL) 10 MG 24 hr tablet, , Disp: , Rfl:  . metFORMIN ER (GLUCOPHAGE-XR) 500 MG extended release tablet, , Disp: , Rfl:  . valsartan (DIOVAN) 320 MG tablet, TAKE 1 TABLET(320 MG) BY MOUTH DAILY, Disp: , Rfl:  . blood sugar diagnostic (ONETOUCH ULTRA TEST) Strp, TEST QD, Disp: , Rfl:  . lancets (ONETOUCH DELICA LANCETS) 30 gauge Misc, TEST QD, Disp: , Rfl:  . potassium chloride ER (KLOR-CON-M, K-DUR) 20 MEQ extended release tablet, TK 2 T PO BID, Disp: , Rfl: 0  A complete ROS was performed with pertinent positives/negatives noted in the HPI. The remainder of the ROS are negative.   Physical Exam:   Ht 1.778 m (5\' 10" )  Wt 125.2 kg (276 lb)  BMI 39.60 kg/m   General: Healthy and alert, in no distress, breathing easily. Normal affect. In a pleasant mood. Head: Normocephalic, atraumatic. No masses, or scars. Eyes: Pupils are equal, and reactive to light. Vision is grossly intact. No spontaneous or gaze nystagmus. Ears:  Right external ear looks normal and healthy. Left side with a ruptured cyst in the posterior aspect of the earlobe, measuring slightly more than 1 cm. Ear canals are clear. Tympanic membranes are intact, with normal landmarks and the middle ears are clear and healthy. Hearing: Grossly normal. Nose: Nasal cavities are clear with healthy mucosa, no polyps or exudate. Airways are patent. Face: No masses or scars, facial nerve function is symmetric. Oral Cavity: No mucosal abnormalities are noted. Tongue with normal mobility. Dentition appears healthy. Oropharynx: Tonsils are symmetric. There are no mucosal masses identified. Tongue base appears normal and healthy. Larynx/Hypopharynx:  deferred Chest: Deferred Neck: No palpable masses, no cervical adenopathy, no thyroid nodules or enlargement. Neuro: Cranial nerves II-XII with normal function. Balance: Normal gate. Other findings: none.  Independent Review of Additional Tests or Records:  none  Procedures:  none  Impression & Plans:  Infected epidermoid cyst left earlobe. Recommend surgical excision under local anesthetic with IV sedation and monitoring.

## 2018-09-04 NOTE — Progress Notes (Signed)
PT in for PAT appt 09/03/18.  Results of blood glucose 469. Pt called and notifed at 0740. Asked him to recheck it now  and go to he ED if greater than 400, Also asked him  to call PCP for appointment.  Results faxed to pt's PCP Dr. Devra Doppamieka Howell at [phone]. Reviewed with Dr Glade Stanford Fitzgerald. Pt will need to have BS stabilized prior to surgery 12/16. Spoke to Jensenracy at Dr [employer]'s office and made her aware of above.

## 2018-09-09 ENCOUNTER — Ambulatory Visit (HOSPITAL_BASED_OUTPATIENT_CLINIC_OR_DEPARTMENT_OTHER): Admission: RE | Admit: 2018-09-09 | Payer: 59 | Source: Home / Self Care | Admitting: Otolaryngology

## 2018-09-09 HISTORY — DX: Preauricular sinus and cyst: Q18.1

## 2018-09-09 SURGERY — EXCISION, CYST, EAR
Anesthesia: General | Laterality: Left

## 2018-10-10 IMAGING — DX DG CHEST 1V PORT
1 series · 1 of 1 positions shown · non-contrast
Comparison: 06/17/2014

CLINICAL DATA: Altered mental status at work

EXAM:
PORTABLE CHEST 1 VIEW

[chest ap]
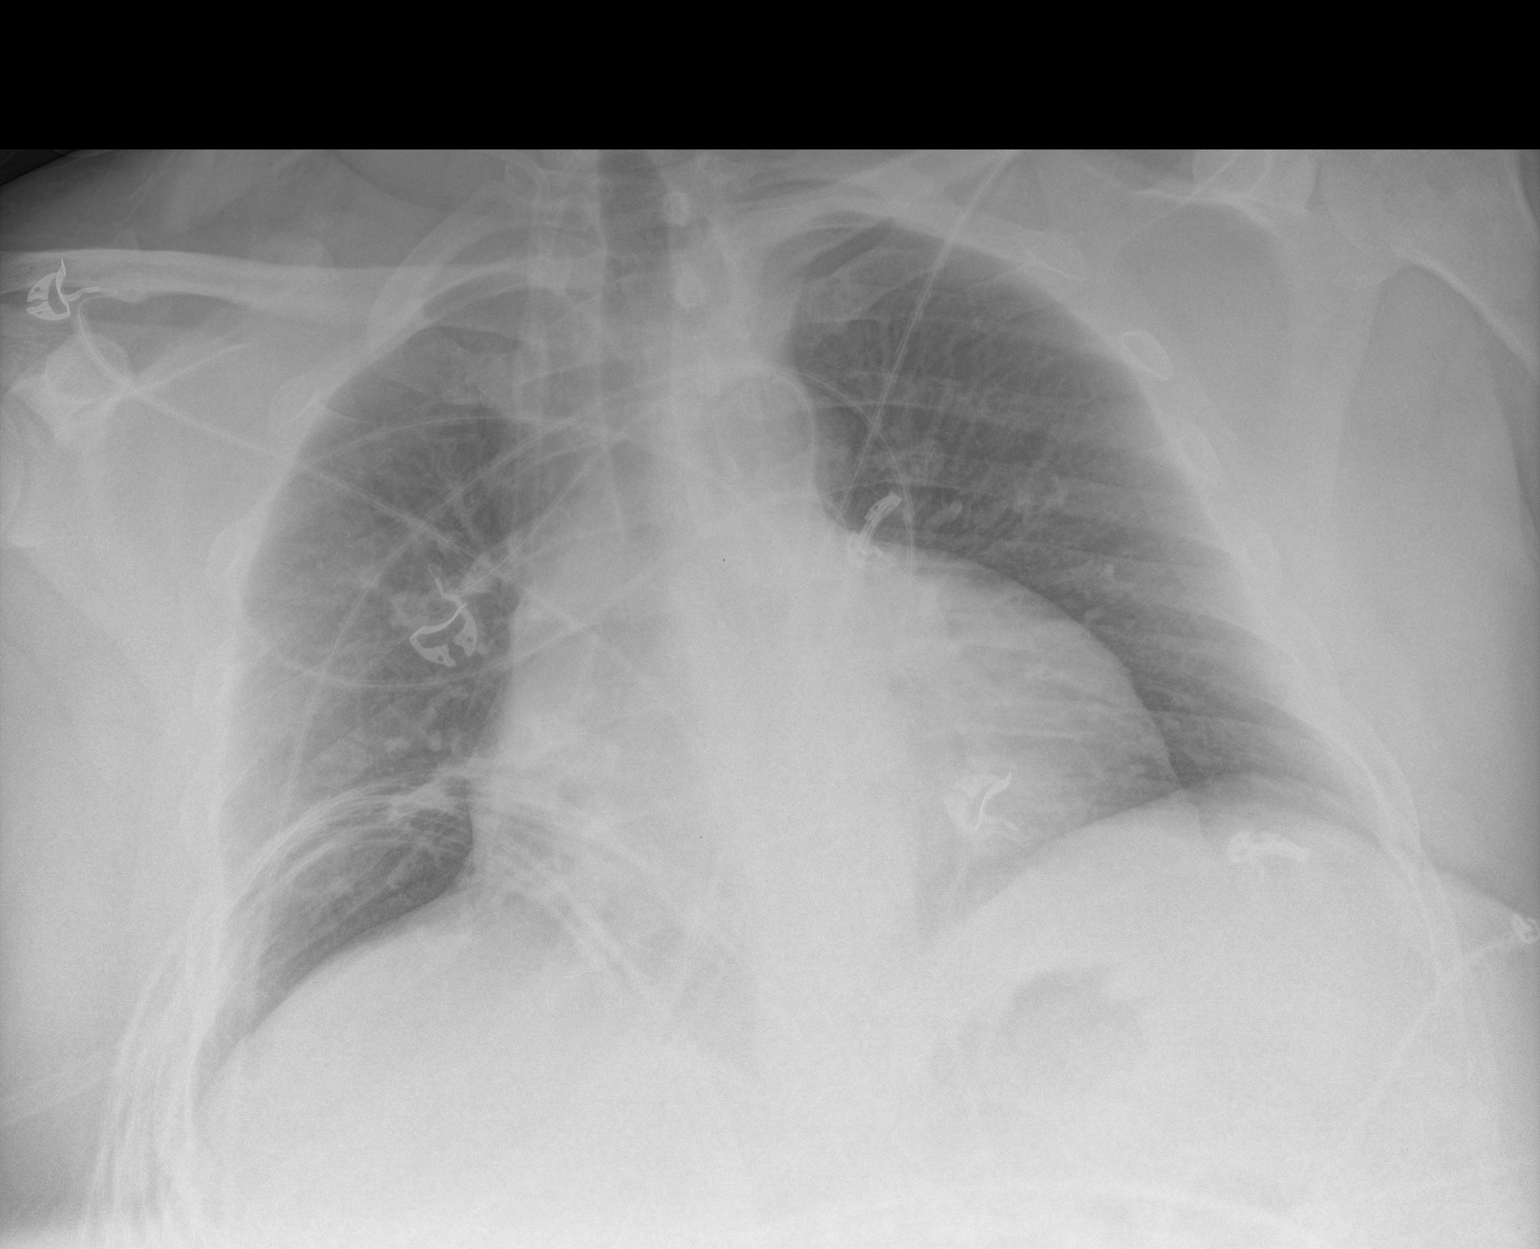

[1 of 1 positions shown; findings below may reference images not displayed]

FINDINGS: The patient is rotated. No acute infiltrate or effusion. Mild
cardiomegaly is exaggerated by rotation. No overt failure. No
pneumothorax.
IMPRESSION: Mild cardiomegaly which is augmented by patient rotation. No acute
infiltrate or edema.

## 2018-10-10 IMAGING — CT CT HEAD W/O CM
3 series · 15 of 47 positions shown, 18 images · non-contrast
Comparison: 06/17/2014

CLINICAL DATA: Confused, her wife not making sense on phone

EXAM:
CT HEAD WITHOUT CONTRAST
TECHNIQUE: Contiguous axial images were obtained from the base of the skull
through the vertex without intravenous contrast.

[Series 2: head wo · axial · 0.46mm/px · z∈[+594,+724]mm · 9 of 32 slices shown, 12 images]
[im 3/32  brain]
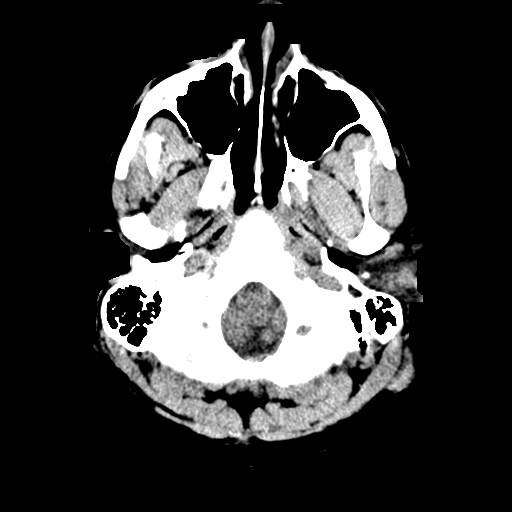
[im 3/32  bone]
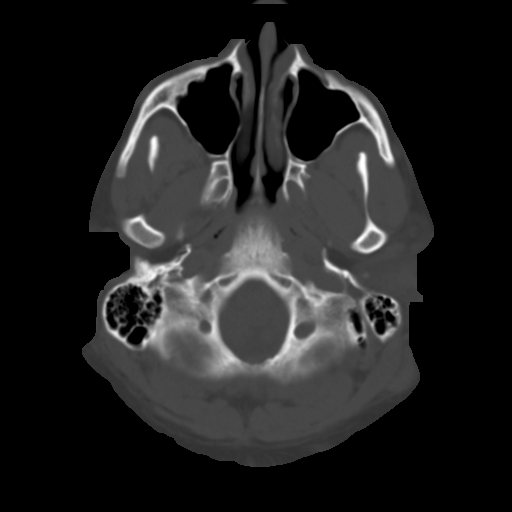
[im 6/32  brain]
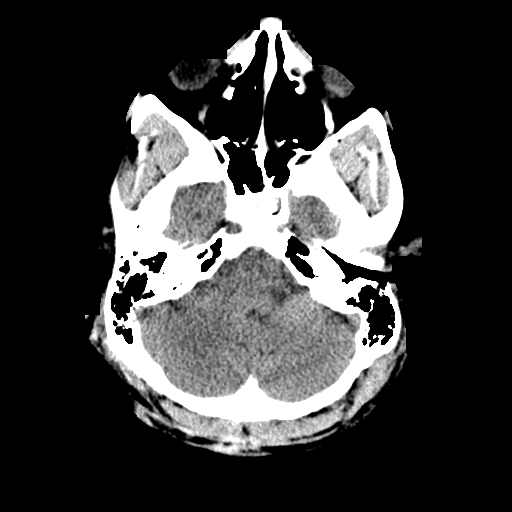
[im 9/32  brain]
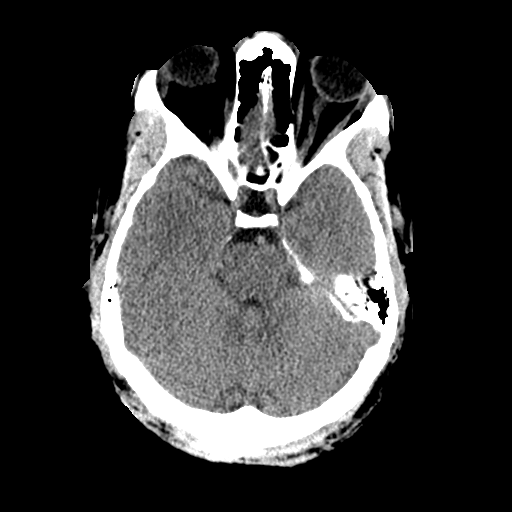
[im 12/32  brain]
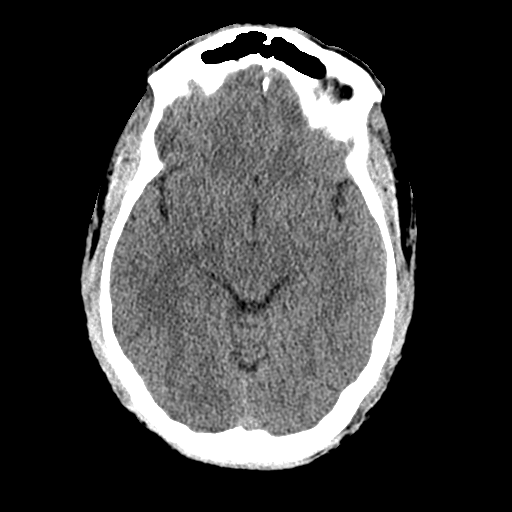
[im 17/32  brain]
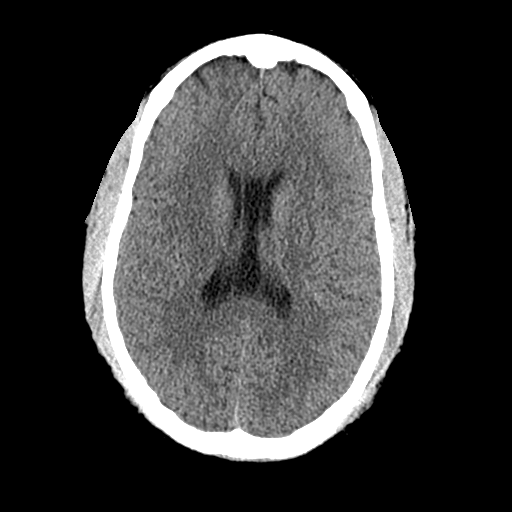
[im 17/32  bone]
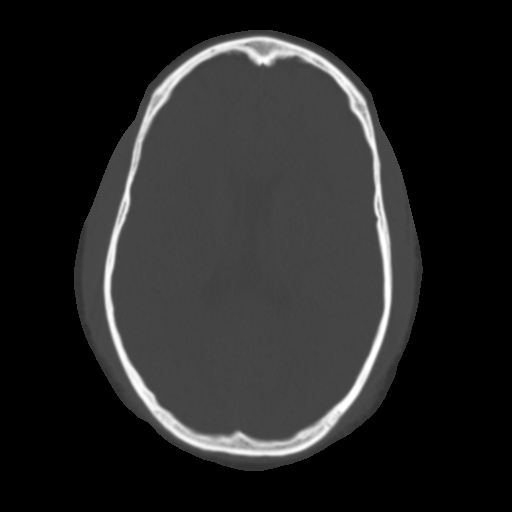
[im 20/32  brain]
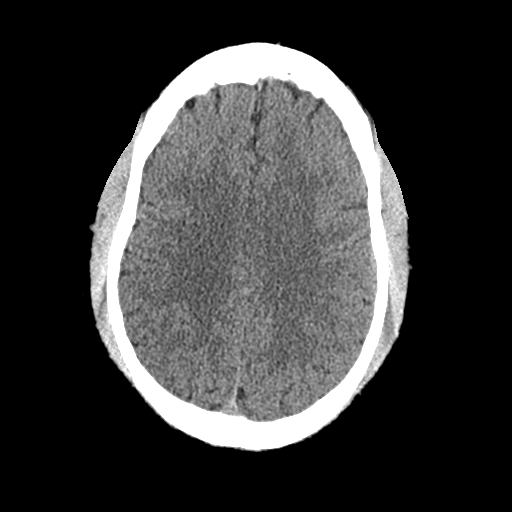
[im 23/32  brain]
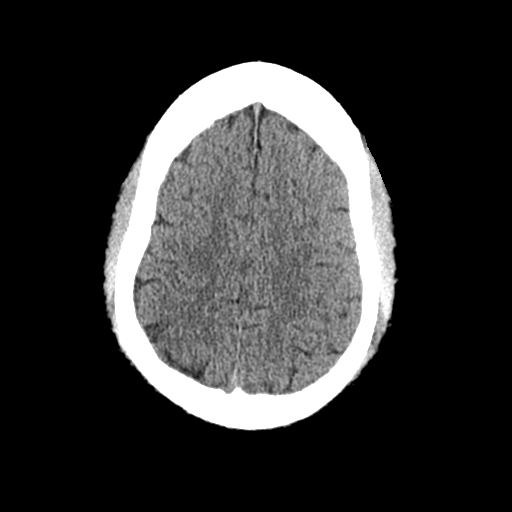
[im 26/32  brain]
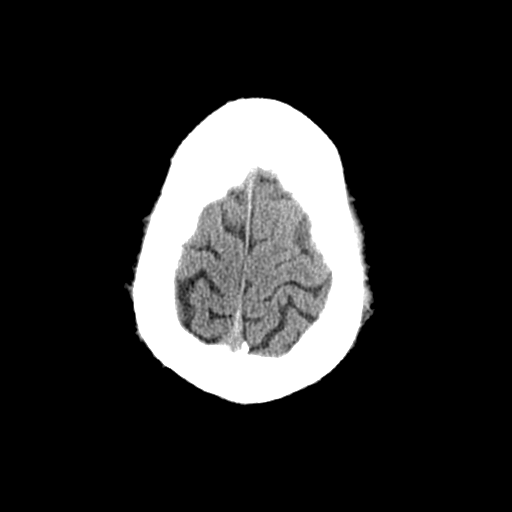
[im 29/32  brain]
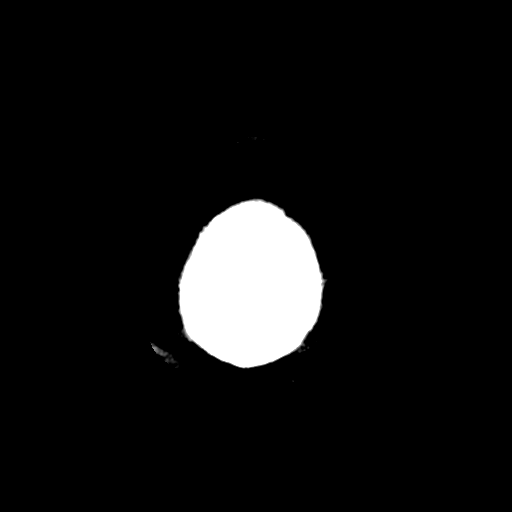
[im 29/32  bone]
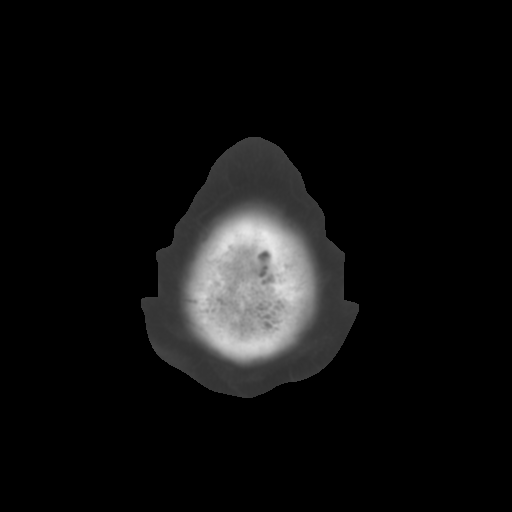

[Series 4: cor soft · coronal · 0.30mm/px · 3 of 77 slices shown]
[im 26/77  brain]
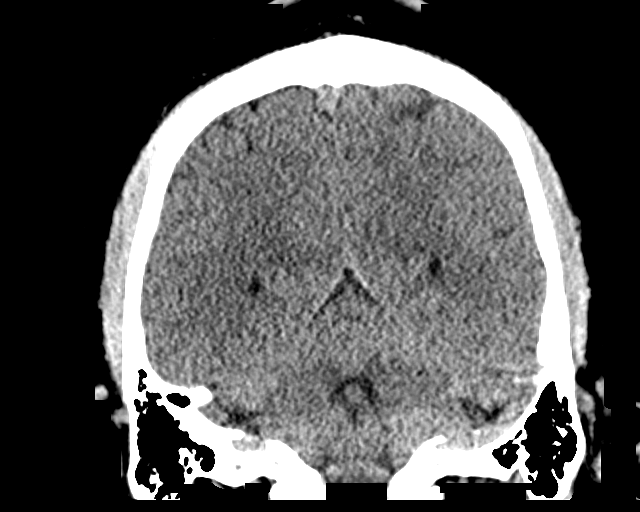
[im 34/77  brain]
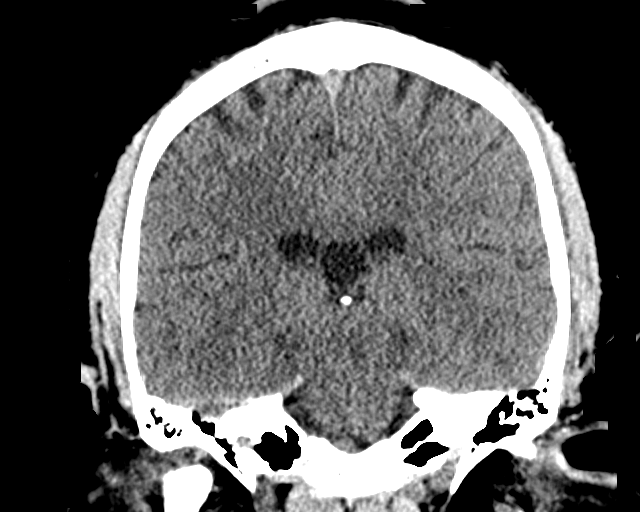
[im 43/77  brain]
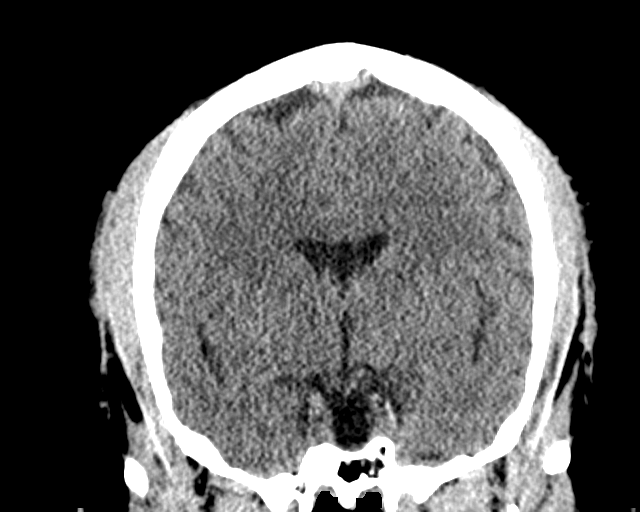

[Series 5: sag soft · sagittal · 0.30mm/px · 3 of 64 slices shown]
[im 22/64  brain]
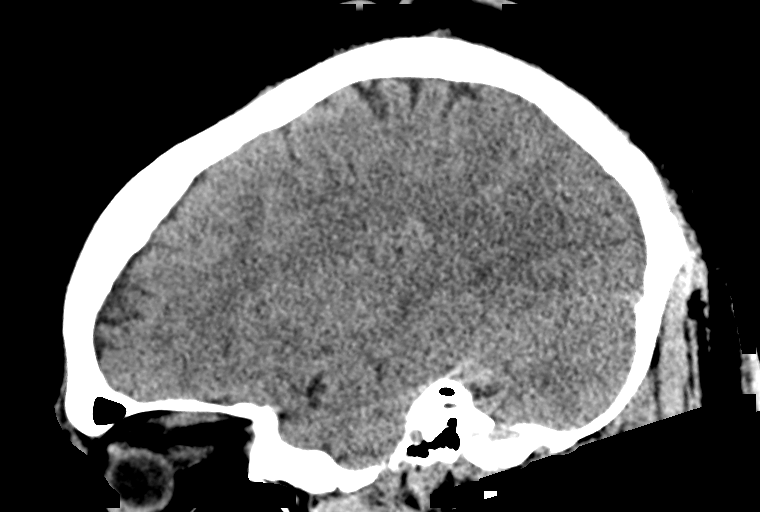
[im 32/64  brain]
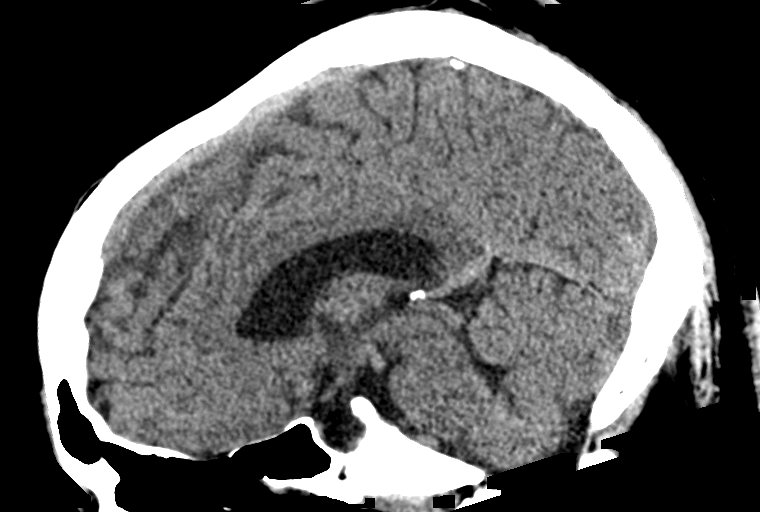
[im 43/64  brain]
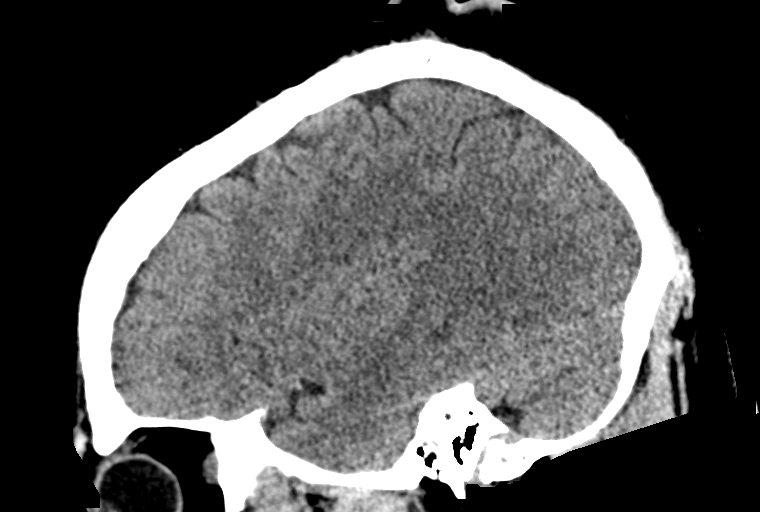

[15 of 47 positions shown; findings below may reference images not displayed]

FINDINGS: Brain: No evidence of acute infarction, hemorrhage, hydrocephalus,
extra-axial collection or mass lesion/mass effect. Again visualized
is a partially empty sella.

Vascular: No hyperdense vessel or unexpected calcification.

Skull: Normal. Negative for fracture or focal lesion.

Sinuses/Orbits: No acute finding.

Other: None
IMPRESSION: No CT evidence for acute intracranial abnormality.

## 2018-10-18 ENCOUNTER — Encounter: Payer: Self-pay | Admitting: Physician Assistant

## 2018-11-13 ENCOUNTER — Ambulatory Visit: Payer: 59 | Admitting: Physician Assistant

## 2018-11-13 ENCOUNTER — Encounter: Payer: Self-pay | Admitting: Physician Assistant

## 2018-11-13 VITALS — BP 120/88 | HR 84 | Ht 70.0 in | Wt 278.0 lb

## 2018-11-13 DIAGNOSIS — K644 Residual hemorrhoidal skin tags: Secondary | ICD-10-CM

## 2018-11-13 DIAGNOSIS — K649 Unspecified hemorrhoids: Secondary | ICD-10-CM | POA: Diagnosis not present

## 2018-11-13 DIAGNOSIS — K648 Other hemorrhoids: Secondary | ICD-10-CM

## 2018-11-13 DIAGNOSIS — K625 Hemorrhage of anus and rectum: Secondary | ICD-10-CM | POA: Diagnosis not present

## 2018-11-13 HISTORY — DX: Residual hemorrhoidal skin tags: K64.4

## 2018-11-13 MED ORDER — NA SULFATE-K SULFATE-MG SULF 17.5-3.13-1.6 GM/177ML PO SOLN: 1.0000 | Freq: Once | ORAL | 0 refills | Status: AC

## 2018-11-13 NOTE — Patient Instructions (Signed)
_X  _   ORAL DIABETIC MEDICATION INSTRUCTIONS  The day before your procedure:  Take your diabetic pill as you do normally  The day of your procedure:  Do not take your diabetic pill   We will check your blood sugar levels during the admission process and again in Recovery before discharging you home  ______________________________________________________________________  _ X _   INSULIN (LONG ACTING) MEDICATION INSTRUCTIONS (Lantus, NPH, 70/30, Humulin, Novolin-N, Levemir, Toujeo )   The day before your procedure:  Take  your regular evening dose    The day of your procedure:  Do not take your morning dose __________________________________________________________________               Juliann Pares _ OTHER NON-INSULIN INJECTABLE MEDICATIONS         (Tanzeum, Trulicity, Byetta, Victoza, Bydureon, SymlinPen)  Hold the am of the procedure  You have been scheduled for a colonoscopy. Please follow written instructions given to you at your visit today.  Please pick up your prep supplies at the pharmacy within the next 1-3 days. If you use inhalers (even only as needed), please bring them with you on the day of your procedure. Your physician has requested that you go to www.startemmi.com and enter the access code given to you at your visit today. This web site gives a general overview about your procedure. However, you should still follow specific instructions given to you by our office regarding your preparation for the procedure.

## 2018-11-13 NOTE — Progress Notes (Signed)
Agree with assessment and plan as outlined.  

## 2018-11-13 NOTE — Progress Notes (Signed)
Subjective:    Patient ID: Miguel Craig, male    DOB: [birth year], 43 y.o.   MRN: [id number]  HPI [name] is a pleasant 43 year old African-American male, new to GI today furred by Dr. Harrell Gave [facility] for consideration of colonoscopy. Patient has not had any prior GI evaluation. He had been seen by Dr. Dema Severin on 09/23/2018 self-referred for hemorrhoidal symptoms and rectal bleeding.  He had been having constipation.  On exam he was noted to have external hemorrhoids most decompressed.  On anoscopy had 3 columns of internal hemorrhoids, there was no fissure or ulceration noted. He was started on a fiber supplement and asked to increase his water intake. Surgical intervention was not felt to be indicated. He is also since been seen by endocrinology and is working on getting his diabetes under control.  He was started on metformin and says that his bowel movements have been much softer and more regular since.  He is not having any problems with straining.  He has no current complaints of anal rectal discomfort no abdominal discomfort.  Though he had been seeing intermittent bright red blood over the past 6 months he has not noticed any blood over the past few weeks. History is negative for colon cancer and polyps as far as he is aware. Other medical issues including sleep apnea with CPAP use, Karlene Lineman, adult onset diabetes mellitus and hypertension.  Review of Systems Pertinent positive and negative review of systems were noted in the above HPI section.  All other review of systems was otherwise negative.  Outpatient Encounter Medications as of 11/13/2018  Medication Sig  . amLODipine (NORVASC) 10 MG tablet Take 10 mg by mouth daily.  Marland Kitchen atorvastatin (LIPITOR) 40 MG tablet Take 40 mg by mouth daily.  . chlorthalidone (HYGROTON) 25 MG tablet Take 25 mg by mouth daily.  . ergocalciferol (VITAMIN D2) 1.25 MG (50000 UT) capsule Take 50,000 Units by mouth once a week.  .  INSULIN DEGLUDEC [place] Inject 0.24 mLs into the skin daily.  Marland Kitchen liraglutide (VICTOZA) 18 MG/3ML SOPN Inject 18 mg into the skin daily.  Marland Kitchen losartan (COZAAR) 100 MG tablet Take 100 mg by mouth daily.  . metFORMIN (GLUMETZA) 500 MG (MOD) 24 hr tablet Take 500 mg by mouth 2 (two) times daily with a meal.   . metoprolol succinate (TOPROL-XL) 25 MG 24 hr tablet Take 25 mg by mouth daily.  . potassium chloride (K-DUR) 10 MEQ tablet Take 10 mEq by mouth daily.  . Na Sulfate-K Sulfate-Mg Sulf 17.5-3.13-1.6 GM/177ML SOLN Take 1 kit by mouth once for 1 dose.  . [DISCONTINUED] glipiZIDE (GLUCOTROL XL) 10 MG 24 hr tablet Take 10 mg by mouth 2 (two) times daily.   . [DISCONTINUED] potassium chloride SA (K-DUR,KLOR-CON) 20 MEQ tablet Take 2 tablets (40 mEq total) by mouth 2 (two) times daily.   No facility-administered encounter medications on file as of 11/13/2018.    Allergies  Allergen Reactions  . Narcan [Naloxone] Other (See Comments)    Lethargic, sleepy, disoriented, altered mental status  . Hydrocodone-Acetaminophen Nausea Only  . Penicillins Other (See Comments)   Patient Active Problem List   Diagnosis Date Noted  . Essential hypertension 09/16/2016  . Type 2 diabetes mellitus without complication, without long-term current use of insulin ([place]) 09/16/2016  . NASH (nonalcoholic steatohepatitis) 09/16/2016  . Hypokalemia 09/16/2016  . OSA on CPAP 09/15/2016   Social History   Socioeconomic History  . Marital status: Single    Spouse name: Not  on file  . Number of children: Not on file  . Years of education: Not on file  . Highest education level: Not on file  Occupational History  . Not on file  Social Needs  . Financial resource strain: Not on file  . Food insecurity:    Worry: Not on file    Inability: Not on file  . Transportation needs:    Medical: Not on file    Non-medical: Not on file  Tobacco Use  . Smoking status: Current Some Day Smoker    Types: Cigarettes  . Smokeless  tobacco: Never Used  Substance and Sexual Activity  . Alcohol use: Yes    Comment: social  . Drug use: No  . Sexual activity: Not on file  Lifestyle  . Physical activity:    Days per week: Not on file    Minutes per session: Not on file  . Stress: Not on file  Relationships  . Social connections:    Talks on phone: Not on file    Gets together: Not on file    Attends religious service: Not on file    Active member of club or organization: Not on file    Attends meetings of clubs or organizations: Not on file    Relationship status: Not on file  . Intimate partner violence:    Fear of current or ex partner: Not on file    Emotionally abused: Not on file    Physically abused: Not on file    Forced sexual activity: Not on file  Other Topics Concern  . Not on file  Social History Narrative  . Not on file    Mr. Lashley family history includes Diabetes in his father; Hyperlipidemia in his father; Hypertension in his mother.      Objective:    Vitals:   11/13/18 1542  BP: 120/88  Pulse: 84    Physical Exam; well-developed African-American male in no acute distress, pleasant, height 5 foot 10, weight 278, BMI 39.8.  HEENT; nontraumatic normocephalic EOMI PERRLA sclera anicteric, oral mucosa moist, Cardiovascular; regular rate and rhythm with S1-S2 no murmur rub or gallop, Pulmonary; clear bilaterally, Abdomen; obese, soft, nontender nondistended bowel sounds are active there is no palpable mass or hepatosplenomegaly.  Rectal; exam not done today recently done per surgery/Dr. Dema Severin.  Extremities; no clubbing cyanosis or edema skin warm and dry.  Neuropsych ;alert and oriented, grossly nonfocal mood and affect appropriate       Assessment & Plan:   #59 43 year old African-American male with intermittent rectal bleeding x6 months and more recent symptomatic hemorrhoids.  Recent anoscopy and rectal exam revealed internal and external hemorrhoids.  Hemorrhoidal symptoms have  improved with resolution of constipation. Etiology of rectal bleeding probably secondary to internal hemorrhoids, however need to rule out occult colon neoplasm.  #2 adult onset diabetes mellitus #3.  Obesity #4.  [place]  #5 obstructive sleep apnea #6.  Hypertension   Plan; patient will continue Benefiber. Will be scheduled for colonoscopy with Dr. Havery Moros.  Procedure was discussed in detail with patient including indications risks and benefits and he is agreeable to proceed. Briefly discussed option of in office hemorrhoidal banding for internal hemorrhoids he continued to have recurrent issues with discomfort, prolapse and bleeding.  Fortunately his above symptoms have recently definitely improved. Saara Kijowski S Tazaria Dlugosz PA-C 11/13/2018   Cc: Helane Rima, MD

## 2018-12-18 ENCOUNTER — Encounter: Payer: 59 | Admitting: Gastroenterology

## 2021-09-27 ENCOUNTER — Ambulatory Visit: Payer: 59 | Admitting: Endocrinology

## 2021-10-12 LAB — HM DIABETES EYE EXAM

## 2021-10-19 ENCOUNTER — Telehealth: Payer: Self-pay

## 2021-10-19 ENCOUNTER — Ambulatory Visit (INDEPENDENT_AMBULATORY_CARE_PROVIDER_SITE_OTHER): Payer: Self-pay | Admitting: Internal Medicine

## 2021-10-19 ENCOUNTER — Other Ambulatory Visit (HOSPITAL_COMMUNITY): Payer: Self-pay

## 2021-10-19 ENCOUNTER — Other Ambulatory Visit: Payer: Self-pay

## 2021-10-19 ENCOUNTER — Encounter: Payer: Self-pay | Admitting: Internal Medicine

## 2021-10-19 VITALS — BP 142/86 | HR 89 | Ht 70.0 in | Wt 270.0 lb

## 2021-10-19 DIAGNOSIS — E119 Type 2 diabetes mellitus without complications: Secondary | ICD-10-CM

## 2021-10-19 DIAGNOSIS — R2681 Unsteadiness on feet: Secondary | ICD-10-CM

## 2021-10-19 DIAGNOSIS — R42 Dizziness and giddiness: Secondary | ICD-10-CM

## 2021-10-19 DIAGNOSIS — E785 Hyperlipidemia, unspecified: Secondary | ICD-10-CM

## 2021-10-19 HISTORY — DX: Dizziness and giddiness: R42

## 2021-10-19 HISTORY — DX: Hyperlipidemia, unspecified: E78.5

## 2021-10-19 LAB — LIPID PANEL
Cholesterol: 136 mg/dL (ref 0–200)
HDL: 32.6 mg/dL — ABNORMAL LOW (ref >39.00)
LDL Cholesterol: 66 mg/dL (ref 0–99)
NonHDL: 103.77
Total CHOL/HDL Ratio: 4
Triglycerides: 191 mg/dL — ABNORMAL HIGH (ref 0.0–149.0)
VLDL: 38.2 mg/dL (ref 0.0–40.0)

## 2021-10-19 LAB — BASIC METABOLIC PANEL
BUN: 14 mg/dL (ref 6–23)
CO2: 31 mEq/L (ref 19–32)
Calcium: 9.3 mg/dL (ref 8.4–10.5)
Chloride: 105 mEq/L (ref 96–112)
Creatinine, Ser: 1.03 mg/dL (ref 0.40–1.50)
GFR: 88.09 mL/min (ref >60.00)
Glucose, Bld: 80 mg/dL (ref 70–99)
Potassium: 3.8 mEq/L (ref 3.5–5.1)
Sodium: 144 mEq/L (ref 135–145)

## 2021-10-19 LAB — POCT GLUCOSE (DEVICE FOR HOME USE): Glucose Fasting, POC: 115 mg/dL — AB (ref 70–99)

## 2021-10-19 LAB — TSH: TSH: 1.49 u[IU]/mL (ref 0.35–5.50)

## 2021-10-19 LAB — MICROALBUMIN / CREATININE URINE RATIO
Creatinine,U: 50.9 mg/dL
Microalb Creat Ratio: 2.9 mg/g (ref 0.0–30.0)
Microalb, Ur: 1.5 mg/dL (ref 0.0–1.9)

## 2021-10-19 LAB — POCT GLYCOSYLATED HEMOGLOBIN (HGB A1C): Hemoglobin A1C: 6 % — AB (ref 4.0–5.6)

## 2021-10-19 LAB — VITAMIN D 25 HYDROXY (VIT D DEFICIENCY, FRACTURES): VITD: 31.07 ng/mL (ref 30.00–100.00)

## 2021-10-19 LAB — VITAMIN B12: Vitamin B-12: 275 pg/mL (ref 211–911)

## 2021-10-19 MED ORDER — TRULICITY 1.5 MG/0.5ML ~~LOC~~ SOAJ: 1.5000 mg | SUBCUTANEOUS | 3 refills | Status: DC

## 2021-10-19 MED ORDER — METFORMIN HCL 1000 MG PO TABS: 1000.0000 mg | ORAL_TABLET | Freq: Two times a day (BID) | ORAL | 3 refills | Status: DC

## 2021-10-19 MED ORDER — EMPAGLIFLOZIN 25 MG PO TABS: 25.0000 mg | ORAL_TABLET | Freq: Every day | ORAL | 3 refills | Status: DC

## 2021-10-19 MED ORDER — MECLIZINE HCL 25 MG PO TABS: 25.0000 mg | ORAL_TABLET | Freq: Two times a day (BID) | ORAL | 0 refills | Status: DC | PRN

## 2021-10-19 NOTE — Patient Instructions (Signed)
-   Trulicity 1.5 mg weekly  ?- Metformin 1000 mg Twice a day  ?- Jardiance 25 mg, 1 tablet daily  ? ? ? ? ?HOW TO TREAT LOW BLOOD SUGARS (Blood sugar LESS THAN 70 MG/DL) ?Please follow the RULE OF 15 for the treatment of hypoglycemia treatment (when your (blood sugars are less than 70 mg/dL)  ? ?STEP 1: Take 15 grams of carbohydrates when your blood sugar is low, which includes:  ?3-4 GLUCOSE TABS  OR ?3-4 OZ OF JUICE OR REGULAR SODA OR ?ONE TUBE OF GLUCOSE GEL   ? ?STEP 2: RECHECK blood sugar in 15 MINUTES ?STEP 3: If your blood sugar is still low at the 15 minute recheck --> then, go back to STEP 1 and treat AGAIN with another 15 grams of carbohydrates. ? ?

## 2021-10-19 NOTE — Telephone Encounter (Signed)
Patient Advocate Encounter   Received notification from [facility] that prior authorization for Trulicity pen injector is required by his/her insurance [place] [employer].   PA submitted on 10/19/21  [place] Tracks #: [id number]  Status is pending    [facility] will continue to follow:  Patient Advocate Fax: [phone]

## 2021-10-19 NOTE — Progress Notes (Signed)
Name: Miguel Craig  MRN/ DOB: [id number], December 03, 1975   Age/ Sex: 46 y.o., male    PCP: Miguel Craig   Reason for Endocrinology Evaluation: Type 2 Diabetes Mellitus     Date of Initial Endocrinology Visit: 10/19/2021     PATIENT IDENTIFIER: Miguel Craig is a 46 y.o. male with a past medical history of T2DM, HTN, NASH, and OSA on CPAP . The patient presented for initial endocrinology clinic visit on 10/19/2021 for consultative assistance with his diabetes management.    HPI: Miguel Craig was    Diagnosed with DM yrs ago Prior Medications tried/Intolerance: n/a Currently checking blood sugars occasionally   Hypoglycemia episodes : no                Hemoglobin A1c has ranged from 5.9% in 2022, peaking at % in 2023. Patient required assistance for hypoglycemia: yes - in 2017 Patient has required hospitalization within the last 1 year from hyper or hypoglycemia: no  In terms of diet, the patient eats 3 meals a day, snacks 2-3 snacks.    Transitioned care from Miguel Craig 09/2021. He was referred here because his provider does not accept medicaid   He was evaluated for a work physical and was c/o balance issues for the past 10 days. NO recent URI , wakes up with dizziness and by afternoon his symptoms resolve.    Had a sleep study  Denies nausea, vomiting or diarrhea   HOME DIABETES REGIMEN: Ozempic 1 mg weekly  Synjardy 01/999 mg BID  Statin: yes ACE-I/ARB: yes    METER DOWNLOAD SUMMARY: did not bring      DIABETIC COMPLICATIONS: Microvascular complications:   Denies: CKD retinopathy, meuropathy  Last eye exam: scheduled 2/5th,2023  Macrovascular complications:   Denies: CAD, PVD, CVA   PAST HISTORY: Past Medical History:  Past Medical History:  Diagnosis Date   Asthma    Cyst on ear    left   Diabetes mellitus without complication (HCC)    Hyperlipidemia    Hypertension    Sleep apnea    uses CPAP nightly   Past Surgical History:  Past  Surgical History:  Procedure Laterality Date   ANKLE SURGERY Left    achilles tendon    Social History:  reports that he has been smoking cigarettes. He has never used smokeless tobacco. He reports current alcohol use. He reports that he does not use drugs. Family History:  Family History  Problem Relation Age of Onset   Hypertension Mother    Diabetes Father    Hyperlipidemia Father    Colon cancer Neg Hx    Esophageal cancer Neg Hx    Pancreatic cancer Neg Hx    Stomach cancer Neg Hx    Liver disease Neg Hx      HOME MEDICATIONS: Allergies as of 10/19/2021       Reactions   Narcan [naloxone] Other (See Comments)   Lethargic, sleepy, disoriented, altered mental status   Hydrocodone-acetaminophen Nausea Only   Penicillins Other (See Comments)        Medication List        Accurate as of October 19, 2021  9:16 AM. If you have any questions, ask your nurse or doctor.          STOP taking these medications    chlorthalidone 25 MG tablet Commonly known as: HYGROTON Stopped by: Scarlette Shorts, MD   ergocalciferol 1.25 MG (50000 UT) capsule Commonly known as: VITAMIN D2 Stopped by:  Scarlette ShortsIbtehal J Dajanee Voorheis, MD   INSULIN DEGLUDEC Clarence Stopped by: Scarlette ShortsIbtehal J Aedyn Mckeon, MD   liraglutide 18 MG/3ML Sopn Commonly known as: VICTOZA Stopped by: Scarlette ShortsIbtehal J Leam Madero, MD   metFORMIN 500 MG (MOD) 24 hr tablet Commonly known as: GLUMETZA Replaced by: metFORMIN 1000 MG tablet Stopped by: Scarlette ShortsIbtehal J Lemmie Vanlanen, MD   Ozempic (1 MG/DOSE) 4 MG/3ML Sopn Generic drug: Semaglutide (1 MG/DOSE) Stopped by: Scarlette ShortsIbtehal J Dvaughn Fickle, MD   Synjardy 01-999 MG Tabs Generic drug: Empagliflozin-metFORMIN HCl Stopped by: Scarlette ShortsIbtehal J Kharter Sestak, MD       TAKE these medications    amLODipine 10 MG tablet Commonly known as: NORVASC Take 10 mg by mouth daily.   atorvastatin 40 MG tablet Commonly known as: LIPITOR Take 40 mg by mouth daily.   empagliflozin 25 MG Tabs  tablet Commonly known as: Jardiance Take 1 tablet (25 mg total) by mouth daily before breakfast. Started by: Scarlette ShortsIbtehal J Fiorela Pelzer, MD   losartan 100 MG tablet Commonly known as: COZAAR Take 100 mg by mouth daily.   meclizine 25 MG tablet Commonly known as: ANTIVERT Take 1 tablet (25 mg total) by mouth 2 (two) times daily as needed for dizziness. Started by: Scarlette ShortsIbtehal J Glenwood Revoir, MD   metFORMIN 1000 MG tablet Commonly known as: GLUCOPHAGE Take 1 tablet (1,000 mg total) by mouth 2 (two) times daily with a meal. Replaces: metFORMIN 500 MG (MOD) 24 hr tablet Started by: Scarlette ShortsIbtehal J Desire Fulp, MD   metoprolol succinate 25 MG 24 hr tablet Commonly known as: TOPROL-XL Take 25 mg by mouth daily.   OneTouch Delica Plus Lancet33G Misc daily.   OneTouch Verio test strip Generic drug: glucose blood USE TO CHECK SUGAR DAILY What changed: Another medication with the same name was removed. Continue taking this medication, and follow the directions you see here. Changed by: Scarlette ShortsIbtehal J Zema Lizardo, MD   potassium chloride 10 MEQ tablet Commonly known as: KLOR-CON Take 10 mEq by mouth daily.   Trulicity 1.5 MG/0.5ML Sopn Generic drug: Dulaglutide Inject 1.5 mg into the skin once a week. Started by: Scarlette ShortsIbtehal J Marixa Mellott, MD         ALLERGIES: Allergies  Allergen Reactions   Narcan [Naloxone] Other (See Comments)    Lethargic, sleepy, disoriented, altered mental status   Hydrocodone-Acetaminophen Nausea Only   Penicillins Other (See Comments)     REVIEW OF SYSTEMS: A comprehensive ROS was conducted with the patient and is negative except as per HPI     OBJECTIVE:   VITAL SIGNS: BP (!) 142/86 (BP Location: Left Arm, Patient Position: Sitting, Cuff Size: Large)    Pulse 89    Ht 5\' 10"  (1.778 m)    Wt 270 lb (122.5 kg)    SpO2 96%    BMI 38.74 kg/m    PHYSICAL EXAM:  General: Pt appears well and is in NAD Ear exam - intact   Neck: General: Supple without adenopathy or  carotid bruits. Thyroid: Thyroid size normal.  No goiter or nodules appreciated.  Lungs: Clear with good BS bilat with no rales, rhonchi, or wheezes  Heart: RRR with normal S1 and S2 and no gallops; no murmurs; no rub  Abdomen: Normoactive bowel sounds, soft, nontender, without masses or organomegaly palpable  Extremities:  Lower extremities - No pretibial edema. No lesions.  Neuro: MS is good with appropriate affect, pt is alert and Ox3    DM foot exam: 10/19/2021  The skin of the feet is intact without sores or ulcerations. The pedal pulses  are 2+ on right and 2+ on left. The sensation is intact to a screening 5.07, 10 gram monofilament bilaterally    DATA REVIEWED:  Lab Results  Component Value Date   HGBA1C 6.0 (A) 10/19/2021   HGBA1C 11.6 (H) 09/16/2016    Latest Reference Range & Units 10/19/21 09:21  Sodium 135 - 145 mEq/L 144  Potassium 3.5 - 5.1 mEq/L 3.8  Chloride 96 - 112 mEq/L 105  CO2 19 - 32 mEq/L 31  Glucose 70 - 99 mg/dL 80  BUN 6 - 23 mg/dL 14  Creatinine 3.89 - 3.73 mg/dL 4.28  Calcium 8.4 - 76.8 mg/dL 9.3  GFR >11.57 mL/min 88.09    Latest Reference Range & Units 10/19/21 09:21  Total CHOL/HDL Ratio  4  Cholesterol 0 - 200 mg/dL 262  HDL Cholesterol >03.55 mg/dL 97.41 (L)  LDL (calc) 0 - 99 mg/dL 66  MICROALB/CREAT RATIO 0.0 - 30.0 mg/g 2.9  NonHDL  103.77  Triglycerides 0.0 - 149.0 mg/dL 638.4 (H)  VLDL 0.0 - 53.6 mg/dL 46.8  VITD 03.21 - 224.82 ng/mL 31.07  Vitamin B12 211 - 911 pg/mL 275    Latest Reference Range & Units 10/19/21 09:21  TSH 0.35 - 5.50 uIU/mL 1.49    Latest Reference Range & Units 10/19/21 09:21  Creatinine,U mg/dL 50.0  Microalb, Ur 0.0 - 1.9 mg/dL 1.5  MICROALB/CREAT RATIO 0.0 - 30.0 mg/g 2.9    ASSESSMENT / PLAN / RECOMMENDATIONS:   1) Type 2 Diabetes Mellitus, Optimally controlled, Without complications - Most recent A1c of 6.0 %. Goal A1c < 7.0 %.    -His A1c has been optimal at 6.0% with Ozempic and Synjardy, but  due to change in insurance the patient has not been able to get those medications  -We will switch Ozempic to Trulicity -We will also separate Synjardy to metformin and Jardiance -BMP normal  MEDICATIONS: Trulicity 1.5 mg weekly Metformin 1000 mg twice daily Jardiance 25 mg daily  EDUCATION / INSTRUCTIONS: BG monitoring instructions: Patient is instructed to check his blood sugars 1 times a day, fasting . Call [place] [facility] if: BG persistently < 70  I reviewed the Rule of 15 for the treatment of hypoglycemia in detail with the patient. Literature supplied.   2) Diabetic complications:  Eye: Does not have known diabetic retinopathy.  Neuro/ Feet: Does not have known diabetic peripheral neuropathy. Renal: Patient does not have known baseline CKD. He is  on an ACEI/ARB at present.   3) Unsteady gait/Dizziness :   - His ears exam in normal  - Will give him meclizine - Will refer to Neurology as this is beyond the scope of endocrinology  -Vitamin B12 is at the lower end of normal, patient will be advised to start OTC vitamin B12 1000 MCG daily    4) Dyslipidemia :  -LDL at goal, TG slightly elevated, will continue to monitor  Medication Continue atorvastatin 40 mg daily  F/U 4 months    Signed electronically by: Lyndle Herrlich, MD  [facility] End[facility]  [facility] [address] [place] [place], [place] [address] Phone: [phone] FAX: [phone]   CC: Gus Height, PA-C No address on file Phone: None  Fax: None    Return to Endocrinology clinic as below: No future appointments.

## 2021-10-20 MED ORDER — ATORVASTATIN CALCIUM 40 MG PO TABS: 40.0000 mg | ORAL_TABLET | Freq: Every day | ORAL | 3 refills | Status: DC

## 2021-10-21 ENCOUNTER — Other Ambulatory Visit (HOSPITAL_COMMUNITY): Payer: Self-pay

## 2021-10-21 NOTE — Telephone Encounter (Signed)
Patient Advocate Encounter  Prior Authorization for Trulicity pen injector has been approved.    [employer] #: [id number]  Effective dates: 10/19/21 through 10/19/22  Per Test Claim Patients co-pay is $25   Spoke with Pharmacy to Process.  Patient Advocate Fax: [phone]

## 2021-10-25 ENCOUNTER — Ambulatory Visit: Payer: 59 | Admitting: Orthopedic Surgery

## 2021-10-25 ENCOUNTER — Encounter: Payer: Self-pay | Admitting: Neurology

## 2021-10-25 ENCOUNTER — Other Ambulatory Visit: Payer: Self-pay

## 2021-10-25 ENCOUNTER — Encounter: Payer: Self-pay | Admitting: Orthopedic Surgery

## 2021-10-25 DIAGNOSIS — M65321 Trigger finger, right index finger: Secondary | ICD-10-CM

## 2021-10-25 HISTORY — DX: Trigger finger, right index finger: M65.321

## 2021-10-25 MED ORDER — BETAMETHASONE SOD PHOS & ACET 6 (3-3) MG/ML IJ SUSP
6.0000 mg | INTRAMUSCULAR | Status: AC | PRN
  Administered 2021-10-25: 6 mg via INTRA_ARTICULAR

## 2021-10-25 MED ORDER — LIDOCAINE HCL 1 % IJ SOLN
1.0000 mL | INTRAMUSCULAR | Status: AC | PRN
  Administered 2021-10-25: 1 mL

## 2021-10-25 NOTE — Progress Notes (Signed)
Office Visit Note   Patient: Miguel Craig           Date of Birth: 02-Mar-1976           MRN: [id number] Visit Date: 10/25/2021              Requested by: No referring provider defined for this encounter. PCP: No primary care provider on file.   Assessment & Plan: Visit Diagnoses:  1. Acquired trigger finger of right index finger     Plan: We discussed the diagnosis, prognosis, non-operative and operative treatment options for trigger finger.  After our discussion, the patient would like to proceed with corticosteroid injection.  We reviewed the risks and benefits of conservative management.  We specifically discussed close monitoring of his blood sugar after steroid injection. The patient expressed understanding of the reasoning and strategy going forward.  All patient questions and concerns were addressed.    Follow-Up Instructions: No follow-ups on file.   Orders:  No orders of the defined types were placed in this encounter.  No orders of the defined types were placed in this encounter.     Procedures: Hand/UE Inj: R index A1 for trigger finger on 10/25/2021 1:38 PM Indications: pain, tendon swelling and therapeutic Details: 25 G needle, volar approach Medications: 1 mL lidocaine 1 %; 6 mg betamethasone acetate-betamethasone sodium phosphate 6 (3-3) MG/ML Outcome: tolerated well, no immediate complications Procedure, treatment alternatives, risks and benefits explained, specific risks discussed. Patient was prepped and draped in the usual sterile fashion.      Clinical Data: No additional findings.   Subjective: Chief Complaint  Patient presents with   Right Index Finger - Pain    Triggering, onset x 3 weeks, + swelling, pain: 5/10, able to get it to work by pressing on the tendon, hard to do things at times.    Is a 46 year old right-hand-dominant male who presents with triggering of the right index finger for the last 3 weeks.  He describes pain over the  index finger A1 pulley.  He feels a locking or pulling sensation in the finger when making a fist.  He has had 1 or 2 episodes where the finger locked and required the other hand to unlock it.  He denies triggering of any other digit.  He is never had any triggering before.  His pain can be as bad as 5/10 at worst.  He is never had any treatment for this issue.   Review of Systems   Objective: Vital Signs: BP 133/87 (BP Location: Left Arm, Patient Position: Sitting)    Pulse 89    Ht 5\' 10"  (1.778 m)    Wt 270 lb (122.5 kg)    BMI 38.74 kg/m   Physical Exam Constitutional:      Appearance: Normal appearance.  Cardiovascular:     Rate and Rhythm: Normal rate.     Pulses: Normal pulses.  Pulmonary:     Effort: Pulmonary effort is normal.  Skin:    General: Skin is warm and dry.     Capillary Refill: Capillary refill takes less than 2 seconds.  Neurological:     Mental Status: He is alert.    Right Hand Exam   Tenderness  Right hand tenderness location: TTP at index finger A1 pulley.  Range of Motion  The patient has normal right wrist ROM.   Other  Erythema: absent Sensation: normal Pulse: present  Comments:  Palpable nodule at level of A1 pulley.  Subtle, palpable  early triggering.  Non TTP throughout remainder of hand.      Specialty Comments:  No specialty comments available.  Imaging: No results found.   PMFS History: Patient Active Problem List   Diagnosis Date Noted   Acquired trigger finger of right index finger 10/25/2021   Dizziness 10/19/2021   Dyslipidemia 10/19/2021   Internal and external bleeding hemorrhoids 11/13/2018   Essential hypertension 09/16/2016   Type 2 diabetes mellitus without complication, without long-term current use of insulin (HCC) 09/16/2016   NASH (nonalcoholic steatohepatitis) 09/16/2016   Hypokalemia 09/16/2016   OSA on CPAP 09/15/2016   Past Medical History:  Diagnosis Date   Asthma    Cyst on ear    left   Diabetes  mellitus without complication (HCC)    Hyperlipidemia    Hypertension    Sleep apnea    uses CPAP nightly    Family History  Problem Relation Age of Onset   Hypertension Mother    Diabetes Father    Hyperlipidemia Father    Colon cancer Neg Hx    Esophageal cancer Neg Hx    Pancreatic cancer Neg Hx    Stomach cancer Neg Hx    Liver disease Neg Hx     Past Surgical History:  Procedure Laterality Date   ANKLE SURGERY Left    achilles tendon   Social History   Occupational History   Not on file  Tobacco Use   Smoking status: Some Days    Types: Cigarettes   Smokeless tobacco: Never  Substance and Sexual Activity   Alcohol use: Yes    Comment: social   Drug use: No   Sexual activity: Not on file

## 2021-12-05 ENCOUNTER — Encounter: Payer: Self-pay | Admitting: Neurology

## 2021-12-05 ENCOUNTER — Ambulatory Visit: Payer: 59 | Admitting: Neurology

## 2021-12-05 ENCOUNTER — Other Ambulatory Visit: Payer: Self-pay

## 2021-12-05 VITALS — BP 152/96 | HR 89 | Ht 70.0 in | Wt 269.4 lb

## 2021-12-05 DIAGNOSIS — R410 Disorientation, unspecified: Secondary | ICD-10-CM | POA: Diagnosis not present

## 2021-12-05 DIAGNOSIS — H539 Unspecified visual disturbance: Secondary | ICD-10-CM

## 2021-12-05 DIAGNOSIS — I951 Orthostatic hypotension: Secondary | ICD-10-CM

## 2021-12-05 DIAGNOSIS — R42 Dizziness and giddiness: Secondary | ICD-10-CM | POA: Diagnosis not present
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
## 2021-12-05 NOTE — Patient Instructions (Signed)
Good to meet you. ? ? ?Schedule MRI brain with and without contrast ? ?2. Schedule EEG ? ?3. Refer to Cardiology for dizziness and orthostatic hypotension ? ?4. Increase hydration, try counterpressure maneuvers ? ?5. Follow-up after tests, call for any changes ?

## 2021-12-06 ENCOUNTER — Ambulatory Visit: Payer: Self-pay

## 2021-12-06 ENCOUNTER — Ambulatory Visit: Payer: 59 | Admitting: Orthopedic Surgery

## 2021-12-06 DIAGNOSIS — M79644 Pain in right finger(s): Secondary | ICD-10-CM

## 2021-12-06 MED ORDER — MELOXICAM 7.5 MG PO TABS: 7.5000 mg | ORAL_TABLET | Freq: Every day | ORAL | 0 refills | Status: AC

## 2021-12-06 NOTE — Progress Notes (Signed)
? ?Office Visit Note ?  ?Patient: Miguel Craig           ?Date of Birth: Oct 29, 1975           ?MRN: [id number] ?Visit Date: 12/06/2021 ?             ?Requested by: No referring provider defined for this encounter. ?PCP: Pcp, No ? ? ?Assessment & Plan: ?Visit Diagnoses:  ?1. Pain in right finger(s)   ? ? ?Plan: Discussed with patient that his issue seems to be more of a soft tissue sprain.  X-rays of middle finger were negative for acute bony injury.  He is able to flex and extend at the DIP joint.  FDS tendon is intact.  He has no PIP or DIP instability.  Most of his pain seems to be around the DIP joint so we will put him in a dorsal extension splint today.  I will send an anti-inflammatory to his pharmacy.  I can see him back in another week if he is still symptomatic. ? ?Follow-Up Instructions: No follow-ups on file.  ? ?Orders:  ?Orders Placed This Encounter  ?Procedures  ? XR Finger Middle Right  ? ?No orders of the defined types were placed in this encounter. ? ? ? ? Procedures: ?No procedures performed ? ? ?Clinical Data: ?No additional findings. ? ? ?Subjective: ?Chief Complaint  ?Patient presents with  ? Right Middle Finger - Injury  ?  Helped aunt moved stove back in place---did the same thing that he did with the index finger  ? ? ?This is a 46 year old right-hand-dominant male who presents with a right middle finger injury.  He was seen previously seen by me for a trigger finger of this right index which has nearly resolved following an injection.  He notes that he was moving some furniture on Saturday when he was gripping the edge of the stove with the very tip of his fingers.  He felt a pop in his middle finger but is not sure where he felt that.  He since developed significant swelling of the entire finger.  His swelling is much improved today compared to when the injury happened on Saturday.  He notes that the finger feels tight with may be more pain around the DIP joint.  He has no pain with range  of motion of the finger but notes that he cannot make a fist secondary to swelling and stiffness. ? ?Injury ? ? ?Review of Systems ? ? ?Objective: ?Vital Signs: There were no vitals taken for this visit. ? ?Physical Exam ? ?Right Hand Exam  ? ?Tenderness  ?Right hand tenderness location: Mildly TTP at dorsal aspect of finger around middle phalanx and DIP joint. ? ?Other  ?Erythema: absent ?Sensation: normal ?Pulse: present ? ?Comments:  Diffuse swelling of the middle finger.  Isolated FDP function is intact.  Isolated FDS function intact.  Able to fully extend at the DIP joint.  Central slip intact w/ Elson test.  No PIP or DIP joint laxity.  No pain w/ PIP hyper-extension.  Unable to make a complete fist with this finger secondary to stiffness.  ? ? ? ? ?Specialty Comments:  ?No specialty comments available. ? ?Imaging: ?No results found. ? ? ?PMFS History: ?Patient Active Problem List  ? Diagnosis Date Noted  ? Acquired trigger finger of right index finger 10/25/2021  ? Dizziness 10/19/2021  ? Dyslipidemia 10/19/2021  ? Internal and external bleeding hemorrhoids 11/13/2018  ? Essential hypertension 09/16/2016  ? Type  2 diabetes mellitus without complication, without long-term current use of insulin (HCC) 09/16/2016  ? NASH (nonalcoholic steatohepatitis) 09/16/2016  ? Hypokalemia 09/16/2016  ? OSA on CPAP 09/15/2016  ? ?Past Medical History:  ?Diagnosis Date  ? Asthma   ? Cyst on ear   ? left  ? Diabetes mellitus without complication (HCC)   ? Hyperlipidemia   ? Hypertension   ? Sleep apnea   ? uses CPAP nightly  ?  ?Family History  ?Problem Relation Age of Onset  ? Hypertension Mother   ? Diabetes Father   ? Hyperlipidemia Father   ? Colon cancer Neg Hx   ? Esophageal cancer Neg Hx   ? Pancreatic cancer Neg Hx   ? Stomach cancer Neg Hx   ? Liver disease Neg Hx   ?  ?Past Surgical History:  ?Procedure Laterality Date  ? ANKLE SURGERY Left   ? achilles tendon  ? ?Social History  ? ?Occupational History  ? Not on  file  ?Tobacco Use  ? Smoking status: Some Days  ?  Types: Cigarettes  ? Smokeless tobacco: Never  ?Vaping Use  ? Vaping Use: Never used  ?Substance and Sexual Activity  ? Alcohol use: Not Currently  ?  Comment: social  ? Drug use: No  ? Sexual activity: Not on file  ? ? ? ? ? ? ?

## 2021-12-06 NOTE — Progress Notes (Signed)
?Cardiology Office Note:   ? ?Date:  12/07/2021  ? ?ID:  Miguel Craig, DOB Apr 15, 1976, MRN [id number] ? ?PCP:  Pcp, No  ? ?[facility] Providers ?Cardiologist:  Alverda Skeans, MD ?Referring MD: Van Clines, MD  ? ?Chief Complaint/Reason for Referral: Orthostatic hypotension ? ?ASSESSMENT:   ? ?Postural dizziness with presyncope - Plan: EKG 12-Lead ? ?Dyspnea, unspecified type ? ?Type 2 diabetes mellitus without complication, without long-term current use of insulin (HCC) ? ?Hypertension associated with diabetes (HCC) ? ?Hyperlipidemia associated with type 2 diabetes mellitus (HCC) ? ?BMI 38.0-38.9,adult ? ?PLAN:   ? ?In order of problems listed above: ? ?1.  Echocardiogram and monitor to evaluate further.   He is not orthostatic and does not meet the criteria for orthostatic blood pressures here in the office.  I do not think that he has diabetic neuropathy.  Follow up in 6 months or earlier needed. ?2.  Continue atorvastatin, losartan, Jardiance, and start aspirin.   ?3.  See discussion above. ?4.  Continue atorvastatin.  LDL recently was at goal of less than 70. ? ? ?Dispo:  Return in about 6 months (around 06/09/2022).  ?  ? ?Medication Adjustments/Labs and Tests Ordered: ?Current medicines are reviewed at length with the patient today.  Concerns regarding medicines are outlined above.  ? ?Tests Ordered: ?Orders Placed This Encounter  ?Procedures  ? EKG 12-Lead  ? ? ?Medication Changes: ?No orders of the defined types were placed in this encounter. ? ? ?History of Present Illness:   ? ?FOCUSED PROBLEM LIST:   ?1.  Type 2 diabetes on metformin ?2.  Hypertension ?3.  Hyperlipidemia ?4.  Obstructive sleep apnea on CPAP ? ?The patient is a 46 y.o. male with the indicated medical history here for recommendations regarding dizziness.  He reported that his head was spinning when he put on his glasses while in class.  This would last for about an hour or so.  By the late afternoon his symptoms basically resolved.   He was seen by neurology and an EEG and MRI were ordered but not yet done. ? ?The patient tells me that he gets dizziness symptoms almost daily.  They can happen when he is just sitting.  When he lays back he feels better.  He has noticed some left ear symptoms as well with little bit of diminished hearing and he tells me a fullness under his left ear.  He denies any chest pain but does get somewhat short of breath with more than moderate exertion.  He denies any peripheral edema.  Has had no paroxysmal nocturnal dyspnea or orthopnea.  He has had no syncope. ? ?We did check orthostatics here with a sitting blood pressure of 140/33mmHg and a standing blood pressure of 130/66mmHg which is a normal response to change in position. ? ?    ?  ?Previous Medical History: ?Past Medical History:  ?Diagnosis Date  ? Asthma   ? Cyst on ear   ? left  ? Diabetes mellitus without complication (HCC)   ? Hyperlipidemia   ? Hypertension   ? Sleep apnea   ? uses CPAP nightly  ? ? ? ?Current Medications: ?Current Meds  ?Medication Sig  ? amLODipine (NORVASC) 10 MG tablet Take 10 mg by mouth daily.  ? atorvastatin (LIPITOR) 40 MG tablet Take 1 tablet (40 mg total) by mouth daily.  ? Dulaglutide (TRULICITY) 1.5 MG/0.5ML SOPN Inject 1.5 mg into the skin once a week.  ? empagliflozin (JARDIANCE) 25 MG TABS  tablet Take 1 tablet (25 mg total) by mouth daily before breakfast.  ? glucose blood (ONETOUCH VERIO) test strip USE TO CHECK SUGAR DAILY  ? Lancets (ONETOUCH DELICA PLUS LANCET33G) MISC daily.  ? losartan (COZAAR) 100 MG tablet Take 100 mg by mouth daily.  ? meclizine (ANTIVERT) 25 MG tablet Take 1 tablet (25 mg total) by mouth 2 (two) times daily as needed for dizziness.  ? meloxicam (MOBIC) 7.5 MG tablet Take 1 tablet (7.5 mg total) by mouth daily for 14 days.  ? metFORMIN (GLUCOPHAGE) 1000 MG tablet Take 1 tablet (1,000 mg total) by mouth 2 (two) times daily with a meal.  ? metoprolol succinate (TOPROL-XL) 25 MG 24 hr tablet Take 25  mg by mouth daily.  ? vitamin B-12 (CYANOCOBALAMIN) 1000 MCG tablet Take 1,000 mcg by mouth daily.  ?  ? ?Allergies:    ?Narcan [naloxone], Hydrocodone-acetaminophen, and Penicillins  ? ?Social History:   ?Social History  ? ?Tobacco Use  ? Smoking status: Some Days  ?  Types: Cigarettes  ? Smokeless tobacco: Never  ?Vaping Use  ? Vaping Use: Never used  ?Substance Use Topics  ? Alcohol use: Not Currently  ?  Comment: social  ? Drug use: No  ?  ? ?Family Hx: ?Family History  ?Problem Relation Age of Onset  ? Hypertension Mother   ? Diabetes Father   ? Hyperlipidemia Father   ? Colon cancer Neg Hx   ? Esophageal cancer Neg Hx   ? Pancreatic cancer Neg Hx   ? Stomach cancer Neg Hx   ? Liver disease Neg Hx   ?  ? ?Review of Systems:   ?Please see the history of present illness.    ?All other systems reviewed and are negative. ?  ? ? ?EKGs/Labs/Other Test Reviewed:   ? ?EKG:  SR ? ?Prior CV studies: ? ?TTE 2017 ?- Left ventricle: The cavity size was normal. Wall thickness was  ?  increased in a pattern of mild LVH. Systolic function was normal.  ?  The estimated ejection fraction was 55%. Wall motion was normal;  ?  there were no regional wall motion abnormalities. Left  ?  ventricular diastolic function parameters were normal.  ?- Right atrium: Central venous pressure (est): 3 mm Hg.  ?- Atrial septum: No defect or patent foramen ovale was identified.  ?- Tricuspid valve: There was trivial regurgitation.  ?- Pulmonary arteries: PA peak pressure: 27 mm Hg (S).  ?- Pericardium, extracardiac: There was no pericardial effusion.  ? ?Imaging studies that I have independently reviewed today: Brain MR; EEG ? ?Recent Labs: ?10/19/2021: BUN 14; Creatinine, Ser 1.03; Potassium 3.8; Sodium 144; TSH 1.49  ? ?Recent Lipid Panel ?Lab Results  ?Component Value Date/Time  ? CHOL 136 10/19/2021 09:21 AM  ? TRIG 191.0 (H) 10/19/2021 09:21 AM  ? HDL 32.60 (L) 10/19/2021 09:21 AM  ? LDLCALC 66 10/19/2021 09:21 AM  ? ? ?Risk  Assessment/Calculations:   ? ? ?    ? ?Physical Exam:   ? ?VS:  BP 130/90 (BP Location: Left Arm, Patient Position: Standing, Cuff Size: Large)   Pulse 86   Ht 5\' 10"  (1.778 m)   Wt 269 lb (122 kg)   SpO2 96%   BMI 38.60 kg/m?    ?Wt Readings from Last 3 Encounters:  ?12/07/21 269 lb (122 kg)  ?12/05/21 269 lb 6.4 oz (122.2 kg)  ?10/25/21 270 lb (122.5 kg)  ?  ?GENERAL:  No apparent distress, AOx3 ?HEENT:  No carotid bruits, +2 carotid impulses, no scleral icterus ?CAR: RRR no murmurs, gallops, rubs, or thrills ?RES:  Clear to auscultation bilaterally ?ABD:  Soft, nontender, nondistended, positive bowel sounds x 4 ?VASC:  +2 radial pulses, +2 carotid pulses, palpable pedal pulses ?NEURO:  CN 2-12 grossly intact; motor and sensory grossly intact ?PSYCH:  No active depression or anxiety ?EXT:  No edema, ecchymosis, or cyanosis ? ?Signed, ?Orbie PyoArun K Emmah Bratcher, MD  ?12/07/2021 10:03 AM    ?[facility] ?[address], [place], [place]  [address] ?Phone: [phone]; Fax: [phone]  ? ?Note:  This document was prepared using Dragon voice recognition software and may include unintentional dictation errors. ?

## 2021-12-07 ENCOUNTER — Ambulatory Visit: Payer: 59 | Admitting: Neurology

## 2021-12-07 ENCOUNTER — Encounter: Payer: Self-pay | Admitting: Internal Medicine

## 2021-12-07 ENCOUNTER — Ambulatory Visit (INDEPENDENT_AMBULATORY_CARE_PROVIDER_SITE_OTHER): Payer: 59

## 2021-12-07 ENCOUNTER — Ambulatory Visit: Payer: 59 | Admitting: Internal Medicine

## 2021-12-07 ENCOUNTER — Other Ambulatory Visit: Payer: Self-pay

## 2021-12-07 VITALS — BP 130/90 | HR 86 | Ht 70.0 in | Wt 269.0 lb

## 2021-12-07 DIAGNOSIS — R42 Dizziness and giddiness: Secondary | ICD-10-CM | POA: Diagnosis not present

## 2021-12-07 DIAGNOSIS — I951 Orthostatic hypotension: Secondary | ICD-10-CM

## 2021-12-07 DIAGNOSIS — R410 Disorientation, unspecified: Secondary | ICD-10-CM | POA: Diagnosis not present

## 2021-12-07 DIAGNOSIS — I152 Hypertension secondary to endocrine disorders: Secondary | ICD-10-CM

## 2021-12-07 DIAGNOSIS — E1169 Type 2 diabetes mellitus with other specified complication: Secondary | ICD-10-CM

## 2021-12-07 DIAGNOSIS — E1159 Type 2 diabetes mellitus with other circulatory complications: Secondary | ICD-10-CM | POA: Diagnosis not present

## 2021-12-07 DIAGNOSIS — H539 Unspecified visual disturbance: Secondary | ICD-10-CM | POA: Diagnosis not present

## 2021-12-07 DIAGNOSIS — E785 Hyperlipidemia, unspecified: Secondary | ICD-10-CM

## 2021-12-07 DIAGNOSIS — R55 Syncope and collapse: Secondary | ICD-10-CM | POA: Diagnosis not present

## 2021-12-07 DIAGNOSIS — Z6838 Body mass index (BMI) 38.0-38.9, adult: Secondary | ICD-10-CM

## 2021-12-07 DIAGNOSIS — R0600 Dyspnea, unspecified: Secondary | ICD-10-CM | POA: Diagnosis not present

## 2021-12-07 DIAGNOSIS — E119 Type 2 diabetes mellitus without complications: Secondary | ICD-10-CM

## 2021-12-07 MED ORDER — ASPIRIN EC 81 MG PO TBEC: 81.0000 mg | DELAYED_RELEASE_TABLET | Freq: Every day | ORAL | 3 refills | Status: AC

## 2021-12-07 NOTE — Progress Notes (Unsigned)
Enrolled for [employer] to mail a ZIO XT long term holter monitor to the patients address on file.  

## 2021-12-07 NOTE — Patient Instructions (Signed)
Medication Instructions:  ?1) START Aspirin 81mg  once daily ? ?*If you need a refill on your cardiac medications before your next appointment, please call your pharmacy* ? ? ?Lab Work: ?None ?If you have labs (blood work) drawn today and your tests are completely normal, you will receive your results only by: ?MyChart Message (if you have MyChart) OR ?A paper copy in the mail ?If you have any lab test that is abnormal or we need to change your treatment, we will call you to review the results. ? ? ?Testing/Procedures: ?Your physician has requested that you have an echocardiogram. Echocardiography is a painless test that uses sound waves to create images of your heart. It provides your doctor with information about the size and shape of your heart and how well your heart?s chambers and valves are working. This procedure takes approximately one hour. There are no restrictions for this procedure. ? ?Your physician recommends that you wear a monitor for 3 days. ? ? ?Follow-Up: ?At [facility], you and your health needs are our priority.  As part of our continuing mission to provide you with exceptional heart care, we have created designated Provider Care Teams.  These Care Teams include your primary Cardiologist (physician) and Advanced Practice Providers (APPs -  Physician Assistants and Nurse Practitioners) who all work together to provide you with the care you need, when you need it. ? ?We recommend signing up for the patient portal called "MyChart".  Sign up information is provided on this After Visit Summary.  MyChart is used to connect with patients for Virtual Visits (Telemedicine).  Patients are able to view lab/test results, encounter notes, upcoming appointments, etc.  Non-urgent messages can be sent to your provider as well.   ?To learn more about what you can do with MyChart, go to [facility].   ? ?Your next appointment:   ?6 month(s) ? ?The format for your next appointment:   ?In  Person ? ?Provider:   ?[email or web address], MD  ? ? ?Other Instructions ? ?ZIO XT- Long Term Monitor Instructions ? ?Your physician has requested you wear a ZIO patch monitor for 3 days.  ?This is a single patch monitor. [employer] supplies one patch monitor per enrollment. Additional ?stickers are not available. Please do not apply patch if you will be having a Nuclear Stress Test,  ?Echocardiogram, Cardiac CT, MRI, or Chest Xray during the period you would be wearing the  ?monitor. The patch cannot be worn during these tests. You cannot remove and re-apply the  ?ZIO XT patch monitor.  ?Your ZIO patch monitor will be mailed 3 day USPS to your address on file. It may take 3-5 days  ?to receive your monitor after you have been enrolled.  ?Once you have received your monitor, please review the enclosed instructions. Your monitor  ?has already been registered assigning a specific monitor serial # to you. ? ?Billing and Patient Assistance Program Information ? ?We have supplied [employer] with any of your insurance information on file for billing purposes. ?[employer] offers a sliding scale Patient Assistance Program for patients that do not have  ?insurance, or whose insurance does not completely cover the cost of the ZIO monitor.  ?You must apply for the Patient Assistance Program to qualify for this discounted rate.  ?To apply, please call [employer] at [phone], select option 4, select option 2, ask to apply for  ?Patient Assistance Program. [phone] will ask your household income, and how many people  ?are in your household. They will  quote your out-of-pocket cost based on that information.  ?[employer] will also be able to set up a 70-month, interest-free payment plan if needed. ? ?Applying the monitor ?  ?Shave hair from upper left chest.  ?Hold abrader disc by orange tab. Rub abrader in 40 strokes over the upper left chest as  ?indicated in your monitor instructions.  ?Clean area with 4 enclosed alcohol pads. Let dry.  ?Apply  patch as indicated in monitor instructions. Patch will be placed under collarbone on left  ?side of chest with arrow pointing upward.  ?Rub patch adhesive wings for 2 minutes. Remove white label marked "1". Remove the white  ?label marked "2". Rub patch adhesive wings for 2 additional minutes.  ?While looking in a mirror, press and release button in center of patch. A small green light will  ?flash 3-4 times. This will be your only indicator that the monitor has been turned on.  ?Do not shower for the first 24 hours. You may shower after the first 24 hours.  ?Press the button if you feel a symptom. You will hear a small click. Record Date, Time and  ?Symptom in the Patient Logbook.  ?When you are ready to remove the patch, follow instructions on the last 2 pages of Patient  ?Logbook. Stick patch monitor onto the last page of Patient Logbook.  ?Place Patient Logbook in the blue and white box. Use locking tab on box and tape box closed  ?securely. The blue and white box has prepaid postage on it. Please place it in the mailbox as  ?soon as possible. Your physician should have your test results approximately 7 days after the  ?monitor has been mailed back to [facility].  ?Call [facility] at [phone] if you have questions regarding  ?your ZIO XT patch monitor. Call them immediately if you see an orange light blinking on your  ?monitor.  ?If your monitor falls off in less than 4 days, contact our Monitor department at [phone].  ?If your monitor becomes loose or falls off after 4 days call [employer] at [phone] for  ?suggestions on securing your monitor ?  ?

## 2021-12-10 DIAGNOSIS — R55 Syncope and collapse: Secondary | ICD-10-CM | POA: Diagnosis not present

## 2021-12-10 DIAGNOSIS — R42 Dizziness and giddiness: Secondary | ICD-10-CM | POA: Diagnosis not present

## 2021-12-12 ENCOUNTER — Ambulatory Visit: Payer: 59 | Admitting: Orthopedic Surgery

## 2021-12-13 ENCOUNTER — Telehealth: Payer: Self-pay | Admitting: Neurology

## 2021-12-13 NOTE — Telephone Encounter (Signed)
Patient called to see if he could get his EEG results. ?

## 2021-12-16 NOTE — Telephone Encounter (Signed)
Patient is still waiting for his EEG result. ?  ?2. Patient received a letter stating his MRI was denied due to lack of information, he'd like to know more about this. ?

## 2021-12-19 ENCOUNTER — Ambulatory Visit: Payer: 59 | Admitting: Neurology

## 2021-12-20 NOTE — Telephone Encounter (Signed)
Pls let him know the brain wave test was normal. I filled out the insurance forms, hopefully they will approve it.  ?

## 2021-12-20 NOTE — Procedures (Signed)
ELECTROENCEPHALOGRAM REPORT ? ?Date of Study: 12/07/2021 ? ?Patient's Name: Miguel Craig ?MRN: [id number] ?Date of Birth: 11/10/1975 ? ?Referring Provider: Dr. Patrcia Dolly ? ?Clinical History: This is a 46 year old man with episodes of dizziness with confusion. EEG for classification. ? ?Medications: ?NORVASC 10 MG tablet ?LIPITOR 40 MG tablet ?TRULICITY 1.5 MG/0.5ML SOPN ?JARDIANCE 25 MG TABS tablet ?COZAAR 100 MG tablet ?ANTIVERT 25 MG tablet ?GLUCOPHAGE 1000 MG tablet ?TOPROL-XL 25 MG 24 hr tablet ?CYANOCOBALAMIN 1000 MCG tablet ? ?Technical Summary: ?A multichannel digital EEG recording measured by the international 10-20 system with electrodes applied with paste and impedances below 5000 ohms performed in our laboratory with EKG monitoring in an awake and asleep patient.  Hyperventilation was not performed. Photic stimulation was performed.  The digital EEG was referentially recorded, reformatted, and digitally filtered in a variety of bipolar and referential montages for optimal display.   ? ?Description: ?The patient is awake and asleep during the recording.  During maximal wakefulness, there is a symmetric, medium voltage 10-11 Hz posterior dominant rhythm that attenuates with eye opening.  The record is symmetric.  During drowsiness and sleep, there is an increase in theta slowing of the background.  Vertex waves and symmetric sleep spindles were seen.  Photic stimulation did not elicit any abnormalities.  There were no epileptiform discharges or electrographic seizures seen.   ? ?EKG lead was unremarkable. ? ?Impression: ?This awake and asleep EEG is normal.   ? ?Clinical Correlation: ?A normal EEG does not exclude a clinical diagnosis of epilepsy.  If further clinical questions remain, prolonged EEG may be helpful.  Clinical correlation is advised. ? ? ?Patrcia Dolly, M.D. ? ?

## 2021-12-20 NOTE — Telephone Encounter (Signed)
Pt called an informed brain wave test was normal. Dr Karel Jarvis filled out the insurance forms, hopefully they will approve it. ?

## 2021-12-22 ENCOUNTER — Ambulatory Visit (HOSPITAL_COMMUNITY): Payer: 59 | Attending: Cardiology

## 2021-12-22 DIAGNOSIS — R55 Syncope and collapse: Secondary | ICD-10-CM | POA: Diagnosis not present

## 2021-12-22 DIAGNOSIS — R42 Dizziness and giddiness: Secondary | ICD-10-CM | POA: Diagnosis present

## 2021-12-22 LAB — ECHOCARDIOGRAM COMPLETE
Area-P 1/2: 6.6 cm2
S' Lateral: 3.7 cm

## 2021-12-26 ENCOUNTER — Ambulatory Visit (HOSPITAL_COMMUNITY)
Admission: RE | Admit: 2021-12-26 | Discharge: 2021-12-26 | Disposition: A | Discharge status: Home / Self Care | Payer: 59 | Source: Ambulatory Visit | Attending: Neurology | Admitting: Neurology

## 2021-12-26 DIAGNOSIS — R410 Disorientation, unspecified: Secondary | ICD-10-CM | POA: Insufficient documentation

## 2021-12-26 DIAGNOSIS — R42 Dizziness and giddiness: Secondary | ICD-10-CM | POA: Insufficient documentation

## 2021-12-26 DIAGNOSIS — I951 Orthostatic hypotension: Secondary | ICD-10-CM | POA: Diagnosis present

## 2021-12-26 DIAGNOSIS — H539 Unspecified visual disturbance: Secondary | ICD-10-CM | POA: Insufficient documentation

## 2021-12-26 MED ORDER — GADOBUTROL 1 MMOL/ML IV SOLN
10.0000 mL | Freq: Once | INTRAVENOUS | Status: AC | PRN
  Administered 2021-12-26: 10 mL via INTRAVENOUS

## 2022-01-02 ENCOUNTER — Ambulatory Visit (INDEPENDENT_AMBULATORY_CARE_PROVIDER_SITE_OTHER): Payer: 59 | Admitting: Family

## 2022-01-02 ENCOUNTER — Encounter: Payer: Self-pay | Admitting: Family

## 2022-01-02 VITALS — BP 138/92 | HR 86 | Temp 98.6°F | Ht 70.0 in | Wt 271.4 lb

## 2022-01-02 DIAGNOSIS — Z9989 Dependence on other enabling machines and devices: Secondary | ICD-10-CM

## 2022-01-02 DIAGNOSIS — I10 Essential (primary) hypertension: Secondary | ICD-10-CM | POA: Diagnosis not present

## 2022-01-02 DIAGNOSIS — G4733 Obstructive sleep apnea (adult) (pediatric): Secondary | ICD-10-CM

## 2022-01-02 DIAGNOSIS — R42 Dizziness and giddiness: Secondary | ICD-10-CM

## 2022-01-02 MED ORDER — OLMESARTAN MEDOXOMIL-HCTZ 20-12.5 MG PO TABS: 1.0000 | ORAL_TABLET | Freq: Every day | ORAL | 0 refills | Status: DC

## 2022-01-02 NOTE — Assessment & Plan Note (Signed)
pt reports the company that supplied his CPAP is no longer in business, he needs new eval for new equipment. sending referral today. ? ?

## 2022-01-02 NOTE — Progress Notes (Signed)
? ?New Patient Office Visit ? ?Subjective:  ?Patient ID: Miguel Craig, male    DOB: 09-Sep-1976  Age: 46 y.o. MRN: [id number] ? ?CC:  ?Chief Complaint  ?Patient presents with  ? Establish Care  ? Hypertension  ? Dizziness  ?  Pt c/o headaches and dizziness mostly in the morning when he wakes up.   ? Employment Physical  ?  Medical clearance to return to work  ? ?HPI ?Miguel Craig presents for establishing care. ? ?Hypertension: Patient is currently maintained on the following medications for blood pressure: Amlodipine, Losartan, Metoprolol ?Patient reports good compliance with blood pressure medications. ?Patient denies chest pain, headaches, shortness of breath or swelling. ?Last 3 blood pressure readings in our office are as follows: ?BP Readings from Last 3 Encounters:  ?01/02/22 (!) 138/92  ?12/07/21 130/90  ?12/05/21 (!) 152/96  ?Dizziness: He reports new onset dizziness. He describes it as feeling like room is spinning, occurs intermittently, and typically lasts minutes.  It typically occurs when he is sitting up from lying position and standing up from siting position. It is usually relieved by lying down. He has not started new medications around the time the dizziness started.  ?Sleep Apnea: He presents for a sleep evaluation. He complains of periods of not breathing, decreased memory, decreased concentration, excessive daytime sleepiness, congested nose.  Symptoms began several years ago,  since that time, gradually improving with CPAP machine.  He denies choking, tossing and turning, decreased sexual drive, impotence, knees buckling with laughing. Previous evaluation and treatment has included PSG with C PAP titration. pt reports the company that supplied his CPAP is no longer in business, he needs new eval for new equipment. ? ?Past Medical History:  ?Diagnosis Date  ? Asthma   ? Confusion 11/08/2016  ? Formatting of this note might be different from the original. ---Jan 2016-Sleep Study---Obstructive  Sleep Apnea  ---Jan 2018-MRI---No acute intracranial abnormality. Partially empty sella. Mild proptosis minimal paranasal sinus  ? Cyst on ear   ? left  ? Diabetes mellitus without complication (HCC)   ? Dyslipidemia 10/19/2021  ? Elevated liver enzymes 01/05/2016  ? Formatting of this note might be different from the original. US liver ordered 01/05/16 Findings consistent with nonalcoholic fatty liver disease, lifestyle interventions discussed with the patient 02/02/16 Hep screening negative  ? Hyperlipidemia   ? Hypertension   ? Hypokalemia 09/16/2016  ? Memory loss 11/08/2016  ? Formatting of this note might be different from the original. ---Jan 2016-Sleep Study---Obstructive Sleep Apnea  ---Jan 2018-MRI---No acute intracranial abnormality. Partially empty sella. Mild proptosis minimal paranasal sinus disease  ? Sleep apnea   ? uses CPAP nightly  ? ? ?Past Surgical History:  ?Procedure Laterality Date  ? ANKLE SURGERY Left   ? achilles tendon  ? ? ?Family History  ?Problem Relation Age of Onset  ? Hypertension Mother   ? Diabetes Father   ? Hyperlipidemia Father   ? Colon cancer Neg Hx   ? Esophageal cancer Neg Hx   ? Pancreatic cancer Neg Hx   ? Stomach cancer Neg Hx   ? Liver disease Neg Hx   ? ? ?Social History  ? ?Socioeconomic History  ? Marital status: Single  ?  Spouse name: Not on file  ? Number of children: 1  ? Years of education: Not on file  ? Highest education level: Not on file  ?Occupational History  ? Not on file  ?Tobacco Use  ? Smoking status: Some Days  ?  Years: 16.00  ?  Types: Cigarettes  ? Smokeless tobacco: Never  ?Vaping Use  ? Vaping Use: Never used  ?Substance and Sexual Activity  ? Alcohol use: Not Currently  ?  Comment: social  ? Drug use: No  ? Sexual activity: Not on file  ?Other Topics Concern  ? Not on file  ?Social History Narrative  ? Right handed   ? ?Social Determinants of Health  ? ?Financial Resource Strain: Not on file  ?Food Insecurity: Not on file  ?Transportation Needs: Not  on file  ?Physical Activity: Not on file  ?Stress: Not on file  ?Social Connections: Not on file  ?Intimate Partner Violence: Not on file  ? ? ?Objective:  ? ?Today's Vitals: BP (!) 138/92 (BP Location: Left Arm, Patient Position: Sitting, Cuff Size: Large)   Pulse 86   Temp 98.6 ?F (37 ?C) (Temporal)   Ht 5\' 10"  (1.778 m)   Wt 271 lb 6.4 oz (123.1 kg)   SpO2 99%   BMI 38.94 kg/m?  ? ?Physical Exam ?Vitals and nursing note reviewed.  ?Constitutional:   ?   General: He is not in acute distress. ?   Appearance: Normal appearance. He is obese.  ?HENT:  ?   Head: Normocephalic.  ?   Right Ear: Tympanic membrane and ear canal normal.  ?   Left Ear: Tympanic membrane and ear canal normal. Decreased hearing noted. No tenderness.  No middle ear effusion. Tympanic membrane is not retracted.  ?Cardiovascular:  ?   Rate and Rhythm: Normal rate and regular rhythm.  ?Pulmonary:  ?   Effort: Pulmonary effort is normal.  ?   Breath sounds: Normal breath sounds.  ?Musculoskeletal:     ?   General: Normal range of motion.  ?   Cervical back: Normal range of motion.  ?Skin: ?   General: Skin is warm and dry.  ?Neurological:  ?   Mental Status: He is alert and oriented to person, place, and time.  ?Psychiatric:     ?   Mood and Affect: Mood normal.  ? ? ?Assessment & Plan:  ? ?Problem List Items Addressed This Visit   ? ?  ? Cardiovascular and Mediastinum  ? Essential hypertension - Primary  ?  Chronic - pt has been on same regimen for years, having new dizziness and unsure if vertigo vs high BP, as it has been high at different times. Also orthostatic recently. pt has accepted a new job at [employer] but not started due to sx. Switching Losartan to Olmesartan-HCTZ, advised pt to take Metoprolol at bedtime and continue Amlodipine in am.  Has form today for me to sign off to go back to to work, advised he needs more time to see if changes today help his sx, he agrees, states work has another training class end of May. ?  ?  ?  Relevant Medications  ? olmesartan-hydrochlorothiazide (BENICAR HCT) 20-12.5 MG tablet  ?  ? Respiratory  ? OSA on CPAP  ?  pt reports the company that supplied his CPAP is no longer in business, he needs new eval for new equipment. sending referral today. ? ?  ?  ? Relevant Orders  ? Ambulatory referral to Pulmonology  ?  ? Other  ? Dizziness ?r/t possible vertigo, also w/orthostatic hypotension. pt having elevated BP readings. Switching meds today. pt advised to still try the Meclizine,1/2 pill tid as 1 pill causes too much drowsiness. Advised on importance of hydration. ?  ?  Severe obesity (BMI 35.0-39.9) with comorbidity (HCC) ?Wt. Loss strategies reviewed including portion control, less carbs including sweets, eating most of calories earlier in day, drinking 64oz water qd, and establishing daily exercise routine.   ? ? ?Outpatient Encounter Medications as of 01/02/2022  ?Medication Sig  ? amLODipine (NORVASC) 10 MG tablet Take 10 mg by mouth daily.  ? aspirin EC 81 MG tablet Take 1 tablet (81 mg total) by mouth daily. Swallow whole.  ? atorvastatin (LIPITOR) 40 MG tablet Take 1 tablet (40 mg total) by mouth daily.  ? Dulaglutide (TRULICITY) 1.5 MG/0.5ML SOPN Inject 1.5 mg into the skin once a week.  ? empagliflozin (JARDIANCE) 25 MG TABS tablet Take 1 tablet (25 mg total) by mouth daily before breakfast.  ? metFORMIN (GLUCOPHAGE) 1000 MG tablet Take 1 tablet (1,000 mg total) by mouth 2 (two) times daily with a meal.  ? metoprolol succinate (TOPROL-XL) 25 MG 24 hr tablet Take 25 mg by mouth daily.  ? olmesartan-hydrochlorothiazide (BENICAR HCT) 20-12.5 MG tablet Take 1 tablet by mouth daily.  ? vitamin B-12 (CYANOCOBALAMIN) 1000 MCG tablet Take 1,000 mcg by mouth daily.  ? [DISCONTINUED] glucose blood (ONETOUCH VERIO) test strip USE TO CHECK SUGAR DAILY  ? [DISCONTINUED] Lancets (ONETOUCH DELICA PLUS LANCET33G) MISC daily.  ? [DISCONTINUED] losartan (COZAAR) 100 MG tablet Take 100 mg by mouth daily.  ? meclizine  (ANTIVERT) 25 MG tablet Take 1 tablet (25 mg total) by mouth 2 (two) times daily as needed for dizziness. (Patient not taking: Reported on 01/02/2022)  ? ?No facility-administered encounter medications on

## 2022-01-02 NOTE — Patient Instructions (Addendum)
Welcome to [facility]! It was a pleasure meeting you today. ? ?As discussed, STOP the Losartan, and I have sent over a new medication, Olmesartan-HCTZ, take this in the morning with the Amlodipine.  ?Switch the Metoprolol to night time. ?Drink at least 2 liters of water daily and eat/drink a low sodium diet. ?You can try cutting the Meclizine pills in half to see if less drowsy and if helps your dizziness, but I'm also hoping getting your blood pressure down to normal will resolve this. ?Please schedule a 1 month follow up visit today so I can see how the medicine is working. ? ? ? ?PLEASE NOTE: ? ?If you had any LAB tests please let us know if you have not heard back within a few days. You may see your results on MyChart before we have a chance to review them but we will give you a call once they are reviewed by [place]. If we ordered any REFERRALS today, please let us know if you have not heard from their office within the next week.  ?Let us know through MyChart if you are needing REFILLS, or have your pharmacy send [place] the request. You can also use MyChart to communicate with me or any office staff. ? ?Please try these tips to maintain a healthy lifestyle: ? ?Eat most of your calories during the day when you are active. Eliminate processed foods including packaged sweets (pies, cakes, cookies), reduce intake of potatoes, white bread, white pasta, and white rice. Look for whole grain options, oat flour or almond flour. ? ?Each meal should contain half fruits/vegetables, one quarter protein, and one quarter carbs (no bigger than a computer mouse). ? ?Cut down on sweet beverages. This includes juice, soda, and sweet tea. Also watch fruit intake, though this is a healthier sweet option, it still contains natural sugar! Limit to 3 servings daily. ? ?Drink at least 1 glass of water with each meal and aim for at least 8 glasses per day ? ?Exercise at least 150 minutes every week.  ? ?

## 2022-01-02 NOTE — Assessment & Plan Note (Signed)
Chronic - pt has been on same regimen for years, having new dizziness and unsure if vertigo vs high BP, as it has been high at different times. Also orthostatic recently. pt has accepted a new job at [employer] but not started due to sx. Switching Losartan to Olmesartan-HCTZ, advised pt to take Metoprolol at bedtime and continue Amlodipine in am.  Has form today for me to sign off to go back to to work, advised he needs more time to see if changes today help his sx, he agrees, states work has another training class end of May. ?

## 2022-01-23 ENCOUNTER — Ambulatory Visit (INDEPENDENT_AMBULATORY_CARE_PROVIDER_SITE_OTHER): Payer: 59 | Admitting: Adult Health

## 2022-01-23 ENCOUNTER — Encounter: Payer: Self-pay | Admitting: Adult Health

## 2022-01-23 VITALS — BP 106/70 | HR 92 | Temp 98.4°F | Ht 70.0 in | Wt 272.2 lb

## 2022-01-23 DIAGNOSIS — G4733 Obstructive sleep apnea (adult) (pediatric): Secondary | ICD-10-CM

## 2022-01-23 DIAGNOSIS — Z9989 Dependence on other enabling machines and devices: Secondary | ICD-10-CM

## 2022-01-23 NOTE — Patient Instructions (Signed)
Restart CPAP At bedtime  ?Wears CPAP all night long. Marland Kitchen ?Continued to work on weight loss ?Do not drive a sleepy.  ?Follow-up with Dr. Vassie Loll in 6-8 weeks with CPAP download and as needed ?

## 2022-01-23 NOTE — Assessment & Plan Note (Signed)
Moderate obstructive sleep apnea on last sleep study.  Patient is encouraged on CPAP compliance.  Patient education given on sleep apnea.  Patient will restart CPAP.  Wear all night long.  Patient education was given on CPAP care.  Order was sent to DME company for refill of CPAP supplies ? ?- discussed how weight can impact sleep and risk for sleep disordered breathing ?- discussed options to assist with weight loss: combination of diet modification, cardiovascular and strength training exercises ?  ?- had an extensive discussion regarding the adverse health consequences related to untreated sleep disordered breathing ?- specifically discussed the risks for hypertension, coronary artery disease, cardiac dysrhythmias, cerebrovascular disease, and diabetes ?- lifestyle modification discussed ?  ?- discussed how sleep disruption can increase risk of accidents, particularly when driving ?- safe driving practices were discussed ?   ?Plan  ?Patient Instructions  ?Restart CPAP At bedtime  ?Wears CPAP all night long. Marland Kitchen ?Continued to work on weight loss ?Do not drive a sleepy.  ?Follow-up with Dr. Vassie Loll in 6-8 weeks with CPAP download and as needed ?  ? ?

## 2022-01-23 NOTE — Progress Notes (Signed)
? ?@Patient  ID: Miguel Craig, male    DOB: July 04, 1976, 46 y.o.   MRN: [id number] ? ?Chief Complaint  ?Patient presents with  ? Consult  ? ? ?Referring provider: ?Dulce SellarHudnell, Stephanie, NP ? ?HPI: ?46 year old male seen for sleep consult Jan 23, 2022 to reestablish for sleep apnea.  Former patient of Dr. Onalee HuaAlvarez ? ?TEST/EVENTS :  ?Sleep study 09/2016 AHI -21.5/hr  Optimal control at 17cmH20.  ? ? ?01/23/2022 Sleep consult  ?Patient presents for a sleep consult today.  Kindly referred by primary care provider Dulce SellarStephanie Hudnell NP .  Patient says he was diagnosed with sleep apnea in 2008.  Says he had a sleep study initially and told that he had severe sleep apnea in 2008.  He was started on CPAP briefly.  Patient says he did not tolerate well.  And returned his machine.  He was seen for sleep consult October 05, 2016.  Set up for a sleep study that showed moderate sleep apnea with AHI 21.5/hour we will control with optimal at CPAP 17 cm H2O.  Patient was recommended to start on CPAP.  Patient says that he started on CPAP.  Wears it several nights a week.  Patient was last seen in the office November 29, 2016.  Patient says that he has not been able to get supplies recently.  All his supplies are old.  And since he has not been here more than 5 years ago he is unable to get new supplies.  Patient says that when he wears his CPAP he feels better.  When he does not wear it he has daytime sleepiness restless sleep and snoring.  Patient typically goes to bed about 10 to 11 PM.  He does work shift work.  Typically takes about 5 to 10 minutes to go to sleep.  It is up about 6 or 7 AM.  Weight has been stable over the last 2 years.  Patient drinks about 2 sodas a day.  Does not take any sleep aids.  Does not feel sleepy when driving.  Currently is not working.  No symptoms suspicious for cataplexy or sleep paralysis.  Epworth score is 19 out of 24.  Patient says he feels very sleepy if he does not have a CPAP on. ? ?Medical history  significant for high blood pressure, diabetes, hyperlipidemia. ? ?Surgical history is none.   ? ?Social history.  Patient is single.  Lives with his family.  Currently unemployed.  Has 1 child.  Smoke about 6 cigarettes a day.  No alcohol or drugs. ? ?Family history positive for diabetes in his father. ? ? ?Allergies  ?Allergen Reactions  ? Narcan [Naloxone] Other (See Comments)  ?  Lethargic, sleepy, disoriented, altered mental status  ? Hydrocodone-Acetaminophen Nausea Only  ? Penicillins Other (See Comments)  ? ? ?Immunization History  ?Administered Date(s) Administered  ? Influenza Split 06/25/2014  ? Influenza, Seasonal, Injecte, Preservative Fre 06/30/2015  ? Influenza,inj,Quad PF,6+ Mos 06/24/2021  ? Influenza,inj,quad, With Preservative 07/14/2016, 08/15/2018, 08/28/2019  ? Influenza-Unspecified 06/25/2014, 06/30/2015, 07/14/2016  ? Janssen (J&J) SARS-COV-2 Vaccination 01/03/2020, 08/30/2020  ? Pneumococcal Polysaccharide-23 10/09/2014  ? Tdap 02/08/2015  ? ? ?Past Medical History:  ?Diagnosis Date  ? Asthma   ? Confusion 11/08/2016  ? Formatting of this note might be different from the original. ---Jan 2016-Sleep Study---Obstructive Sleep Apnea  ---Jan 2018-MRI---No acute intracranial abnormality. Partially empty sella. Mild proptosis minimal paranasal sinus  ? Cyst on ear   ? left  ? Diabetes mellitus without complication (  HCC)   ? Dyslipidemia 10/19/2021  ? Elevated liver enzymes 01/05/2016  ? Formatting of this note might be different from the original. US liver ordered 01/05/16 Findings consistent with nonalcoholic fatty liver disease, lifestyle interventions discussed with the patient 02/02/16 Hep screening negative  ? Hyperlipidemia   ? Hypertension   ? Hypokalemia 09/16/2016  ? Memory loss 11/08/2016  ? Formatting of this note might be different from the original. ---Jan 2016-Sleep Study---Obstructive Sleep Apnea  ---Jan 2018-MRI---No acute intracranial abnormality. Partially empty sella. Mild proptosis  minimal paranasal sinus disease  ? Sleep apnea   ? uses CPAP nightly  ? ? ?Tobacco History: ?Social History  ? ?Tobacco Use  ?Smoking Status Some Days  ? Packs/day: 1.00  ? Years: 16.00  ? Pack years: 16.00  ? Types: Cigarettes  ?Smokeless Tobacco Never  ?Tobacco Comments  ? Smoking 4-5 cigarettes/day.  01/23/2022 hfb  ? ?Ready to quit: Not Answered ?Counseling given: Not Answered ?Tobacco comments: Smoking 4-5 cigarettes/day.  01/23/2022 hfb ? ? ?Outpatient Medications Prior to Visit  ?Medication Sig Dispense Refill  ? amLODipine (NORVASC) 10 MG tablet Take 10 mg by mouth daily.    ? aspirin EC 81 MG tablet Take 1 tablet (81 mg total) by mouth daily. Swallow whole. 90 tablet 3  ? atorvastatin (LIPITOR) 40 MG tablet Take 1 tablet (40 mg total) by mouth daily. 90 tablet 3  ? Dulaglutide (TRULICITY) 1.5 MG/0.5ML SOPN Inject 1.5 mg into the skin once a week. 6 mL 3  ? empagliflozin (JARDIANCE) 25 MG TABS tablet Take 1 tablet (25 mg total) by mouth daily before breakfast. 90 tablet 3  ? metFORMIN (GLUCOPHAGE) 1000 MG tablet Take 1 tablet (1,000 mg total) by mouth 2 (two) times daily with a meal. 180 tablet 3  ? metoprolol succinate (TOPROL-XL) 25 MG 24 hr tablet Take 25 mg by mouth daily.    ? olmesartan-hydrochlorothiazide (BENICAR HCT) 20-12.5 MG tablet Take 1 tablet by mouth daily. 30 tablet 0  ? vitamin B-12 (CYANOCOBALAMIN) 1000 MCG tablet Take 1,000 mcg by mouth daily.    ? meclizine (ANTIVERT) 25 MG tablet Take 1 tablet (25 mg total) by mouth 2 (two) times daily as needed for dizziness. (Patient not taking: Reported on 01/02/2022) 10 tablet 0  ? ?No facility-administered medications prior to visit.  ? ? ? ?Review of Systems:  ? ?Constitutional:   No  weight loss, night sweats,  Fevers, chills,  ?+fatigue, or  lassitude. ? ?HEENT:   No headaches,  Difficulty swallowing,  Tooth/dental problems, or  Sore throat,  ?              No sneezing, itching, ear ache, nasal congestion, post nasal drip,  ? ?CV:  No chest pain,   Orthopnea, PND, swelling in lower extremities, anasarca, dizziness, palpitations, syncope.  ? ?GI  No heartburn, indigestion, abdominal pain, nausea, vomiting, diarrhea, change in bowel habits, loss of appetite, bloody stools.  ? ?Resp: .  No chest wall deformity ? ?Skin: no rash or lesions. ? ?GU: no dysuria, change in color of urine, no urgency or frequency.  No flank pain, no hematuria  ? ?MS:  No joint pain or swelling.  No decreased range of motion.  No back pain. ? ? ? ?Physical Exam ? ?BP 106/70 (BP Location: Left Arm, Patient Position: Sitting, Cuff Size: Large)   Pulse 92   Temp 98.4 ?F (36.9 ?C) (Oral)   Ht 5\' 10"  (1.778 m)   Wt 272 lb 3.2 oz (  123.5 kg)   SpO2 99%   BMI 39.06 kg/m?  ? ?GEN: A/Ox3; pleasant , NAD, well nourished  ?  ?HEENT:  [place]/AT,   NOSE-clear, THROAT-clear, no lesions, no postnasal drip or exudate noted.  ?Class III MP airway ? ?NECK:  Supple w/ fair ROM; no JVD; normal carotid impulses w/o bruits; no thyromegaly or nodules palpated; no lymphadenopathy.   ? ?RESP  Clear  P & A; w/o, wheezes/ rales/ or rhonchi. no accessory muscle use, no dullness to percussion ? ?CARD:  RRR, no m/r/g, no peripheral edema, pulses intact, no cyanosis or clubbing. ? ?GI:   Soft & nt; nml bowel sounds; no organomegaly or masses detected.  ? ?Musco: Warm bil, no deformities or joint swelling noted.  ? ?Neuro: alert, no focal deficits noted.   ? ?Skin: Warm, no lesions or rashes ? ? ? ?Lab Results: ? ? ?BNP ?No results found for: BNP ? ?ProBNP ?No results found for: PROBNP ? ?Imaging: ? ? ? ? ?   ? View : No data to display.  ?  ?  ?  ? ? ?No results found for: NITRICOXIDE ? ? ? ? ? ?Assessment & Plan:  ? ?OSA on CPAP ?Moderate obstructive sleep apnea on last sleep study.  Patient is encouraged on CPAP compliance.  Patient education given on sleep apnea.  Patient will restart CPAP.  Wear all night long.  Patient education was given on CPAP care.  Order was sent to DME company for refill of CPAP  supplies ? ?- discussed how weight can impact sleep and risk for sleep disordered breathing ?- discussed options to assist with weight loss: combination of diet modification, cardiovascular and strength training exercises

## 2022-01-23 NOTE — Assessment & Plan Note (Signed)
Healthy weight loss discussed 

## 2022-01-29 NOTE — Progress Notes (Signed)
?Name: Miguel Craig  ?MRN/ DOB: [id number], 1975/12/09   ?Age/ Sex: 46 y.o., male   ? ?PCP: Miguel Sellar, NP   ?Reason for Endocrinology Evaluation: Type 2 Diabetes Mellitus  ?   ?Date of Initial Endocrinology Visit: 10/19/2021  ? ? ?PATIENT IDENTIFIER: Miguel Craig is a 46 y.o. male with a past medical history of T2DM, HTN, NASH, and OSA on CPAP . The patient presented for initial endocrinology clinic visit on 10/19/2021 for consultative assistance with his diabetes management.  ? ? ?HPI: ?Miguel Craig was  ? ? ?Diagnosed with DM yrs ago.             ?Hemoglobin A1c has ranged from 5.9% in 2022, peaking at % in 2023. ?Patient required assistance for hypoglycemia: yes - in 2017 ? ? ?Transitioned care from Dr. Shawnee Knapp 09/2021.  ? ?On his initial visit to our clinic his A1c was 6.0 % on Trulicity and Synjary but had to switch Synh=jardy to Metforin and Jardiance due to insurance changes  ? ? ? ? ?SUBJECTIVE:  ? ?During the last visit (10/19/2021): A1c 6.0% continued trulicity, metformin and Jardiance  ? ?Today (01/29/22): Miguel Craig  is here for a follow up on diabetes management. He checks his blood sugars 4 times a week. The patient has not had hypoglycemic episodes since the last clinic visit ? ?Dizziness resolved  ?Denies nausea, vomiting or diarrhea  ? ? ?HOME DIABETES REGIMEN: ?Trulicity 1.5 mg weekly  ?Metformin 1000 mg BID  ?Jardiance 25 mg weekly  ? ? ? ?Statin: yes ?ACE-I/ARB: yes ? ? ? ?METER DOWNLOAD SUMMARY: did not bring  ? ? ? ? ?DIABETIC COMPLICATIONS: ?Microvascular complications:  ? ?Denies: CKD retinopathy, meuropathy  ?Last eye exam: scheduled 2/5th,2023 ? ?Macrovascular complications:  ? ?Denies: CAD, PVD, CVA ? ? ?PAST HISTORY: ?Past Medical History:  ?Past Medical History:  ?Diagnosis Date  ? Asthma   ? Confusion 11/08/2016  ? Formatting of this note might be different from the original. ---Jan 2016-Sleep Study---Obstructive Sleep Apnea  ---Jan 2018-MRI---No acute intracranial  abnormality. Partially empty sella. Mild proptosis minimal paranasal sinus  ? Cyst on ear   ? left  ? Diabetes mellitus without complication (HCC)   ? Dyslipidemia 10/19/2021  ? Elevated liver enzymes 01/05/2016  ? Formatting of this note might be different from the original. US liver ordered 01/05/16 Findings consistent with nonalcoholic fatty liver disease, lifestyle interventions discussed with the patient 02/02/16 Hep screening negative  ? Hyperlipidemia   ? Hypertension   ? Hypokalemia 09/16/2016  ? Memory loss 11/08/2016  ? Formatting of this note might be different from the original. ---Jan 2016-Sleep Study---Obstructive Sleep Apnea  ---Jan 2018-MRI---No acute intracranial abnormality. Partially empty sella. Mild proptosis minimal paranasal sinus disease  ? Sleep apnea   ? uses CPAP nightly  ? ?Past Surgical History:  ?Past Surgical History:  ?Procedure Laterality Date  ? ANKLE SURGERY Left   ? achilles tendon  ?  ?Social History:  reports that he has been smoking cigarettes. He has a 16.00 pack-year smoking history. He has never used smokeless tobacco. He reports that he does not currently use alcohol. He reports that he does not use drugs. ?Family History:  ?Family History  ?Problem Relation Age of Onset  ? Hypertension Mother   ? Diabetes Father   ? Hyperlipidemia Father   ? Colon cancer Neg Hx   ? Esophageal cancer Neg Hx   ? Pancreatic cancer Neg Hx   ? Stomach cancer Neg Hx   ?  Liver disease Neg Hx   ? ? ? ?HOME MEDICATIONS: ?Allergies as of 01/30/2022   ? ?   Reactions  ? Narcan [naloxone] Other (See Comments)  ? Lethargic, sleepy, disoriented, altered mental status  ? Hydrocodone-acetaminophen Nausea Only  ? Penicillins Other (See Comments)  ? ?  ? ?  ?Medication List  ?  ? ?  ? Accurate as of Jan 29, 2022  7:54 PM. If you have any questions, ask your nurse or doctor.  ?  ?  ? ?  ? ?amLODipine 10 MG tablet ?Commonly known as: NORVASC ?Take 10 mg by mouth daily. ?  ?aspirin EC 81 MG tablet ?Take 1 tablet (81  mg total) by mouth daily. Swallow whole. ?  ?atorvastatin 40 MG tablet ?Commonly known as: LIPITOR ?Take 1 tablet (40 mg total) by mouth daily. ?  ?empagliflozin 25 MG Tabs tablet ?Commonly known as: Jardiance ?Take 1 tablet (25 mg total) by mouth daily before breakfast. ?  ?meclizine 25 MG tablet ?Commonly known as: ANTIVERT ?Take 1 tablet (25 mg total) by mouth 2 (two) times daily as needed for dizziness. ?  ?metFORMIN 1000 MG tablet ?Commonly known as: GLUCOPHAGE ?Take 1 tablet (1,000 mg total) by mouth 2 (two) times daily with a meal. ?  ?metoprolol succinate 25 MG 24 hr tablet ?Commonly known as: TOPROL-XL ?Take 25 mg by mouth daily. ?  ?olmesartan-hydrochlorothiazide 20-12.5 MG tablet ?Commonly known as: BENICAR HCT ?Take 1 tablet by mouth daily. ?  ?Trulicity 1.5 MG/0.5ML Sopn ?Generic drug: Dulaglutide ?Inject 1.5 mg into the skin once a week. ?  ?vitamin B-12 1000 MCG tablet ?Commonly known as: CYANOCOBALAMIN ?Take 1,000 mcg by mouth daily. ?  ? ?  ? ? ? ?ALLERGIES: ?Allergies  ?Allergen Reactions  ? Narcan [Naloxone] Other (See Comments)  ?  Lethargic, sleepy, disoriented, altered mental status  ? Hydrocodone-Acetaminophen Nausea Only  ? Penicillins Other (See Comments)  ? ? ? ?REVIEW OF SYSTEMS: ?A comprehensive ROS was conducted with the patient and is negative except as per HPI  ? ?  ?OBJECTIVE:  ? ?VITAL SIGNS: There were no vitals taken for this visit.  ? ?PHYSICAL EXAM:  ?General: Pt appears well and is in NAD ?Ear exam - intact   ?Neck: General: Supple without adenopathy or carotid bruits. ?Thyroid: Thyroid size normal.  No goiter or nodules appreciated.  ?Lungs: Clear with good BS bilat with no rales, rhonchi, or wheezes  ?Heart: RRR with normal S1 and S2 and no gallops; no murmurs; no rub  ?Abdomen: Normoactive bowel sounds, soft, nontender, without masses or organomegaly palpable  ?Extremities:  ?Lower extremities - No pretibial edema. No lesions.  ?Neuro: MS is good with appropriate affect, pt  is alert and Ox3  ? ? ?DM foot exam: 10/19/2021 ? ?The skin of the feet is intact without sores or ulcerations. ?The pedal pulses are 2+ on right and 2+ on left. ?The sensation is intact to a screening 5.07, 10 gram monofilament bilaterally ? ? ? ?DATA REVIEWED: ? ?Lab Results  ?Component Value Date  ? HGBA1C 6.0 (A) 10/19/2021  ? HGBA1C 11.6 (H) 09/16/2016  ? ? Latest Reference Range & Units 10/19/21 09:21  ?Sodium 135 - 145 mEq/L 144  ?Potassium 3.5 - 5.1 mEq/L 3.8  ?Chloride 96 - 112 mEq/L 105  ?CO2 19 - 32 mEq/L 31  ?Glucose 70 - 99 mg/dL 80  ?BUN 6 - 23 mg/dL 14  ?Creatinine 0.40 - 1.50 mg/dL 1.61  ?Calcium 8.4 - 10.5 mg/dL  9.3  ?GFR >60.00 mL/min 88.09  ? ? Latest Reference Range & Units 10/19/21 09:21  ?Total CHOL/HDL Ratio  4  ?Cholesterol 0 - 200 mg/dL 161136  ?HDL Cholesterol >39.00 mg/dL 09.6032.60 (L)  ?LDL (calc) 0 - 99 mg/dL 66  ?MICROALB/CREAT RATIO 0.0 - 30.0 mg/g 2.9  ?NonHDL  103.77  ?Triglycerides 0.0 - 149.0 mg/dL 454.0191.0 (H)  ?VLDL 0.0 - 40.0 mg/dL 98.138.2  ?VITD 30.00 - 100.00 ng/mL 31.07  ?Vitamin B12 211 - 911 pg/mL 275  ? ? Latest Reference Range & Units 10/19/21 09:21  ?TSH 0.35 - 5.50 uIU/mL 1.49  ? ? Latest Reference Range & Units 10/19/21 09:21  ?Creatinine,U mg/dL 19.150.9  ?Microalb, Ur 0.0 - 1.9 mg/dL 1.5  ?MICROALB/CREAT RATIO 0.0 - 30.0 mg/g 2.9  ? ? ?ASSESSMENT / PLAN / RECOMMENDATIONS:  ? ?1) Type 2 Diabetes Mellitus, Optimally controlled, Without complications - Most recent A1c of 6.2 %. Goal A1c < 7.0 %.   ? ?-His A1c has been optimal  ?- Tolerating medications without issues  ?-No change  ? ?MEDICATIONS: ?Trulicity 1.5 mg weekly ?Metformin 1000 mg twice daily ?Jardiance 25 mg daily ? ?EDUCATION / INSTRUCTIONS: ?BG monitoring instructions: Patient is instructed to check his blood sugars 1 times a day, fasting . ?Call [facility] if: BG persistently < 70  ?I reviewed the Rule of 15 for the treatment of hypoglycemia in detail with the patient. Literature supplied. ? ? ?2) Diabetic  complications:  ?Eye: Does not have known diabetic retinopathy.  ?Neuro/ Feet: Does not have known diabetic peripheral neuropathy. ?Renal: Patient does not have known baseline CKD. He is  on an ACEI/ARB at pres

## 2022-01-30 ENCOUNTER — Encounter: Payer: Self-pay | Admitting: Internal Medicine

## 2022-01-30 ENCOUNTER — Ambulatory Visit (INDEPENDENT_AMBULATORY_CARE_PROVIDER_SITE_OTHER): Payer: 59 | Admitting: Internal Medicine

## 2022-01-30 VITALS — BP 122/84 | HR 86 | Ht 70.0 in | Wt 271.0 lb

## 2022-01-30 DIAGNOSIS — E119 Type 2 diabetes mellitus without complications: Secondary | ICD-10-CM | POA: Diagnosis not present

## 2022-01-30 DIAGNOSIS — E785 Hyperlipidemia, unspecified: Secondary | ICD-10-CM

## 2022-01-30 DIAGNOSIS — R739 Hyperglycemia, unspecified: Secondary | ICD-10-CM

## 2022-01-30 LAB — POCT GLYCOSYLATED HEMOGLOBIN (HGB A1C): Hemoglobin A1C: 6.2 % — AB (ref 4.0–5.6)

## 2022-01-30 LAB — POCT GLUCOSE (DEVICE FOR HOME USE): Glucose Fasting, POC: 174 mg/dL — AB (ref 70–99)

## 2022-01-30 NOTE — Patient Instructions (Signed)
-   Trulicity 1.5 mg weekly  ?- Metformin 1000 mg Twice a day  ?- Jardiance 25 mg, 1 tablet daily  ? ? ? ? ?HOW TO TREAT LOW BLOOD SUGARS (Blood sugar LESS THAN 70 MG/DL) ?Please follow the RULE OF 15 for the treatment of hypoglycemia treatment (when your (blood sugars are less than 70 mg/dL)  ? ?STEP 1: Take 15 grams of carbohydrates when your blood sugar is low, which includes:  ?3-4 GLUCOSE TABS  OR ?3-4 OZ OF JUICE OR REGULAR SODA OR ?ONE TUBE OF GLUCOSE GEL   ? ?STEP 2: RECHECK blood sugar in 15 MINUTES ?STEP 3: If your blood sugar is still low at the 15 minute recheck --> then, go back to STEP 1 and treat AGAIN with another 15 grams of carbohydrates. ? ?

## 2022-02-02 ENCOUNTER — Ambulatory Visit (INDEPENDENT_AMBULATORY_CARE_PROVIDER_SITE_OTHER): Payer: 59 | Admitting: Family

## 2022-02-02 ENCOUNTER — Encounter: Payer: Self-pay | Admitting: Family

## 2022-02-02 VITALS — BP 128/82 | HR 85 | Temp 98.3°F | Ht 70.0 in | Wt 268.2 lb

## 2022-02-02 DIAGNOSIS — E538 Deficiency of other specified B group vitamins: Secondary | ICD-10-CM

## 2022-02-02 DIAGNOSIS — I10 Essential (primary) hypertension: Secondary | ICD-10-CM

## 2022-02-02 LAB — BASIC METABOLIC PANEL
BUN: 15 mg/dL (ref 6–23)
CO2: 27 mEq/L (ref 19–32)
Calcium: 9.2 mg/dL (ref 8.4–10.5)
Chloride: 102 mEq/L (ref 96–112)
Creatinine, Ser: 1.1 mg/dL (ref 0.40–1.50)
GFR: 81.24 mL/min (ref >60.00)
Glucose, Bld: 89 mg/dL (ref 70–99)
Potassium: 3.2 mEq/L — ABNORMAL LOW (ref 3.5–5.1)
Sodium: 140 mEq/L (ref 135–145)

## 2022-02-02 LAB — VITAMIN B12: Vitamin B-12: 1034 pg/mL — ABNORMAL HIGH (ref 211–911)

## 2022-02-02 MED ORDER — AMLODIPINE BESYLATE 10 MG PO TABS: 10.0000 mg | ORAL_TABLET | Freq: Every day | ORAL | 1 refills | Status: DC

## 2022-02-02 MED ORDER — OLMESARTAN MEDOXOMIL-HCTZ 20-12.5 MG PO TABS: 1.0000 | ORAL_TABLET | Freq: Every day | ORAL | 1 refills | Status: DC

## 2022-02-02 MED ORDER — METOPROLOL SUCCINATE ER 25 MG PO TB24: 25.0000 mg | ORAL_TABLET | Freq: Every evening | ORAL | 1 refills | Status: DC

## 2022-02-02 NOTE — Assessment & Plan Note (Addendum)
Chronic - BP doing good today, pt tolerating Olmesartan-HCTZ. Advised to continue, sending refill, continue Amlodipine & Metoprolol (qhs). Denies any more dizziness. Signed form for return to work. f/u in 3 mos. checking BMP today. ?

## 2022-02-02 NOTE — Progress Notes (Signed)
? ?Subjective:  ? ? ? Patient ID: Miguel Craig, male    DOB: 18-Jun-1976, 46 y.o.   MRN: [id number] ? ?Chief Complaint  ?Patient presents with  ? Hypertension  ? ?HPI: ?Hypertension: Patient is currently maintained on the following medications for blood pressure: Amlodipine, Olmesartan-HCTZ, Metoprolol ?Patient reports good compliance with blood pressure medications. ?Patient denies chest pain, headaches, shortness of breath or swelling. ?Last 3 blood pressure readings in our office are as follows: ?BP Readings from Last 3 Encounters:  ?02/02/22 128/82  ?01/30/22 122/84  ?01/23/22 106/70  ? ? ?Assessment & Plan:  ? ?Problem List Items Addressed This Visit   ? ?  ? Cardiovascular and Mediastinum  ? Essential hypertension - Primary  ?  Chronic - BP doing good today, pt tolerating Olmesartan-HCTZ. Advised to continue, sending refill, continue Amlodipine & Metoprolol (qhs). Denies any more dizziness. Signed form for return to work. f/u in 3 mos. checking BMP today. ? ?  ?  ? Relevant Medications  ? olmesartan-hydrochlorothiazide (BENICAR HCT) 20-12.5 MG tablet  ? amLODipine (NORVASC) 10 MG tablet  ? metoprolol succinate (TOPROL-XL) 25 MG 24 hr tablet  ? Other Relevant Orders  ? Basic Metabolic Panel (BMET)  ? ?Other Visit Diagnoses   ? ? Vitamin B12 deficiency      ? Relevant Orders  ? Vitamin B12  ? ?  ? ?Outpatient Medications Prior to Visit  ?Medication Sig Dispense Refill  ? aspirin EC 81 MG tablet Take 1 tablet (81 mg total) by mouth daily. Swallow whole. 90 tablet 3  ? atorvastatin (LIPITOR) 40 MG tablet Take 1 tablet (40 mg total) by mouth daily. 90 tablet 3  ? Dulaglutide (TRULICITY) 1.5 MG/0.5ML SOPN Inject 1.5 mg into the skin once a week. 6 mL 3  ? empagliflozin (JARDIANCE) 25 MG TABS tablet Take 1 tablet (25 mg total) by mouth daily before breakfast. 90 tablet 3  ? meclizine (ANTIVERT) 25 MG tablet Take 1 tablet (25 mg total) by mouth 2 (two) times daily as needed for dizziness. 10 tablet 0  ? metFORMIN  (GLUCOPHAGE) 1000 MG tablet Take 1 tablet (1,000 mg total) by mouth 2 (two) times daily with a meal. 180 tablet 3  ? vitamin B-12 (CYANOCOBALAMIN) 1000 MCG tablet Take 1,000 mcg by mouth daily.    ? amLODipine (NORVASC) 10 MG tablet Take 10 mg by mouth daily.    ? metoprolol succinate (TOPROL-XL) 25 MG 24 hr tablet Take 25 mg by mouth daily.    ? olmesartan-hydrochlorothiazide (BENICAR HCT) 20-12.5 MG tablet Take 1 tablet by mouth daily. 30 tablet 0  ? ?No facility-administered medications prior to visit.  ? ? ?Past Medical History:  ?Diagnosis Date  ? Asthma   ? Confusion 11/08/2016  ? Formatting of this note might be different from the original. ---Jan 2016-Sleep Study---Obstructive Sleep Apnea  ---Jan 2018-MRI---No acute intracranial abnormality. Partially empty sella. Mild proptosis minimal paranasal sinus  ? Cyst on ear   ? left  ? Diabetes mellitus without complication (HCC)   ? Dyslipidemia 10/19/2021  ? Elevated liver enzymes 01/05/2016  ? Formatting of this note might be different from the original. US liver ordered 01/05/16 Findings consistent with nonalcoholic fatty liver disease, lifestyle interventions discussed with the patient 02/02/16 Hep screening negative  ? Hyperlipidemia   ? Hypertension   ? Hypokalemia 09/16/2016  ? Memory loss 11/08/2016  ? Formatting of this note might be different from the original. ---Jan 2016-Sleep Study---Obstructive Sleep Apnea  ---Jan 2018-MRI---No acute intracranial abnormality.  Partially empty sella. Mild proptosis minimal paranasal sinus disease  ? Sleep apnea   ? uses CPAP nightly  ? ? ?Past Surgical History:  ?Procedure Laterality Date  ? ANKLE SURGERY Left   ? achilles tendon  ? ? ?Allergies  ?Allergen Reactions  ? Narcan [Naloxone] Other (See Comments)  ?  Lethargic, sleepy, disoriented, altered mental status  ? Hydrocodone-Acetaminophen Nausea Only  ? Penicillins Other (See Comments)  ? ? ?   ?Objective:  ?  ?Physical Exam ?Vitals and nursing note reviewed.   ?Constitutional:   ?   General: He is not in acute distress. ?   Appearance: Normal appearance.  ?HENT:  ?   Head: Normocephalic.  ?Cardiovascular:  ?   Rate and Rhythm: Normal rate and regular rhythm.  ?Pulmonary:  ?   Effort: Pulmonary effort is normal.  ?   Breath sounds: Normal breath sounds.  ?Musculoskeletal:     ?   General: Normal range of motion.  ?   Cervical back: Normal range of motion.  ?Skin: ?   General: Skin is warm and dry.  ?Neurological:  ?   Mental Status: He is alert and oriented to person, place, and time.  ?Psychiatric:     ?   Mood and Affect: Mood normal.  ? ? ?BP 128/82 (BP Location: Left Arm, Patient Position: Sitting, Cuff Size: Large)   Pulse 85   Temp 98.3 ?F (36.8 ?C) (Temporal)   Ht 5\' 10"  (1.778 m)   Wt 268 lb 4 oz (121.7 kg)   SpO2 94%   BMI 38.49 kg/m?  ?Wt Readings from Last 3 Encounters:  ?02/02/22 268 lb 4 oz (121.7 kg)  ?01/30/22 271 lb (122.9 kg)  ?01/23/22 272 lb 3.2 oz (123.5 kg)  ? ? ?   ? ?Meds ordered this encounter  ?Medications  ? olmesartan-hydrochlorothiazide (BENICAR HCT) 20-12.5 MG tablet  ?  Sig: Take 1 tablet by mouth daily.  ?  Dispense:  90 tablet  ?  Refill:  1  ?  STOP the Losartan  ?  Order Specific Question:   Supervising Provider  ?  Answer:   ANDY, CAMILLE L [[id number]]  ? amLODipine (NORVASC) 10 MG tablet  ?  Sig: Take 1 tablet (10 mg total) by mouth daily.  ?  Dispense:  90 tablet  ?  Refill:  1  ?  Order Specific Question:   Supervising Provider  ?  Answer:   ANDY, CAMILLE L [[id number]]  ? metoprolol succinate (TOPROL-XL) 25 MG 24 hr tablet  ?  Sig: Take 1 tablet (25 mg total) by mouth at bedtime.  ?  Dispense:  90 tablet  ?  Refill:  1  ?  Order Specific Question:   Supervising Provider  ?  Answer:   ANDY, CAMILLE L [[id number]]  ? ? ?03/25/22, NP ? ?

## 2022-02-02 NOTE — Patient Instructions (Addendum)
It was very nice to see you today! ? ?Go to the lab today for blood work. ?I sent all your blood pressure med refills into your pharmacy. Continue taking as prescribed.  ?Your Blood pressure is doing great! Keep up the work with drinking 2 Liters water daily, eating low sodium diet, and try to exercise 20-30 minutes daily getting your heart rate up to around 120s. ? ?Follow up in 3 mos. ? ? ? ?PLEASE NOTE: ? ?If you had any lab tests please let us know if you have not heard back within a few days. You may see your results on MyChart before we have a chance to review them but we will give you a call once they are reviewed by [place]. If we ordered any referrals today, please let us know if you have not heard from their office within the next week.  ? ?

## 2022-02-13 ENCOUNTER — Ambulatory Visit: Payer: 59 | Admitting: Emergency Medicine

## 2022-02-17 ENCOUNTER — Ambulatory Visit: Payer: Medicaid Other | Admitting: Internal Medicine

## 2022-03-10 ENCOUNTER — Encounter: Payer: Self-pay | Admitting: Pulmonary Disease

## 2022-03-10 ENCOUNTER — Ambulatory Visit: Payer: 59 | Admitting: Pulmonary Disease

## 2022-03-10 DIAGNOSIS — G4733 Obstructive sleep apnea (adult) (pediatric): Secondary | ICD-10-CM

## 2022-03-10 DIAGNOSIS — Z9989 Dependence on other enabling machines and devices: Secondary | ICD-10-CM | POA: Diagnosis not present

## 2022-03-10 NOTE — Progress Notes (Signed)
   Subjective:    Patient ID: Miguel Craig, male    DOB: 04-18-1976, 46 y.o.   MRN: [id number]  HPI  46 year old for follow-up of OSA. He was initially diagnosed in 2008 but did not tolerate CPAP.  He was reassessed in 2018, diagnosed with moderate OSA with started on CPAP 18 cm   Chief Complaint  Patient presents with   Follow-up    Not using CPAP b/c he doesn't have a mask. Will go today to get it.   Lost his job and insurance and was unable to get supplies.  He reestablished a month ago and is about to get his full facemask today from DME. His machine dates back to 2018.  He denies any problems with mask or pressure.  He needs new supplies He has been very compliant in the past.  Tolerates high pressure very well    Significant tests/ events reviewed  Split night >>09/2016 AHI -21.5/hr  Optimal control at 17cmH20.  Review of Systems neg for any significant sore throat, dysphagia, itching, sneezing, nasal congestion or excess/ purulent secretions, fever, chills, sweats, unintended wt loss, pleuritic or exertional cp, hempoptysis, orthopnea pnd or change in chronic leg swelling. Also denies presyncope, palpitations, heartburn, abdominal pain, nausea, vomiting, diarrhea or change in bowel or urinary habits, dysuria,hematuria, rash, arthralgias, visual complaints, headache, numbness weakness or ataxia.     Objective:   Physical Exam  Gen. Pleasant, obese, in no distress ENT - no lesions, no post nasal drip Neck: No JVD, no thyromegaly, no carotid bruits Lungs: no use of accessory muscles, no dullness to percussion, decreased without rales or rhonchi  Cardiovascular: Rhythm regular, heart sounds  normal, no murmurs or gallops, no peripheral edema Musculoskeletal: No deformities, no cyanosis or clubbing , no tremors       Assessment & Plan:

## 2022-03-10 NOTE — Assessment & Plan Note (Signed)
CPAP supplies will be renewed for a year. Previously noted to be compliant on CPAP 18 cm,  Weight loss encouraged, compliance with goal of at least 4-6 hrs every night is the expectation. Advised against medications with sedative side effects Cautioned against driving when sleepy - understanding that sleepiness will vary on a day to day basis

## 2022-03-10 NOTE — Patient Instructions (Signed)
  CPAP supplies will be renewed as needed  Check out [email or web address] for prices

## 2022-05-05 ENCOUNTER — Ambulatory Visit: Payer: 59 | Admitting: Family

## 2022-05-10 DIAGNOSIS — G4733 Obstructive sleep apnea (adult) (pediatric): Secondary | ICD-10-CM | POA: Diagnosis not present

## 2022-05-23 NOTE — Progress Notes (Unsigned)
Patient ID: Miguel Craig, male    DOB: 07-Dec-1975, 46 y.o.   MRN: [id number]  No chief complaint on file.   HPI:   Assessment & Plan:   Problem List Items Addressed This Visit   None   Subjective:    Outpatient Medications Prior to Visit  Medication Sig Dispense Refill  . amLODipine (NORVASC) 10 MG tablet Take 1 tablet (10 mg total) by mouth daily. 90 tablet 1  . aspirin EC 81 MG tablet Take 1 tablet (81 mg total) by mouth daily. Swallow whole. 90 tablet 3  . atorvastatin (LIPITOR) 40 MG tablet Take 1 tablet (40 mg total) by mouth daily. 90 tablet 3  . Dulaglutide (TRULICITY) 1.5 MG/0.5ML SOPN Inject 1.5 mg into the skin once a week. 6 mL 3  . empagliflozin (JARDIANCE) 25 MG TABS tablet Take 1 tablet (25 mg total) by mouth daily before breakfast. 90 tablet 3  . meclizine (ANTIVERT) 25 MG tablet Take 1 tablet (25 mg total) by mouth 2 (two) times daily as needed for dizziness. 10 tablet 0  . metFORMIN (GLUCOPHAGE) 1000 MG tablet Take 1 tablet (1,000 mg total) by mouth 2 (two) times daily with a meal. 180 tablet 3  . metoprolol succinate (TOPROL-XL) 25 MG 24 hr tablet Take 1 tablet (25 mg total) by mouth at bedtime. 90 tablet 1  . olmesartan-hydrochlorothiazide (BENICAR HCT) 20-12.5 MG tablet Take 1 tablet by mouth daily. 90 tablet 1  . vitamin B-12 (CYANOCOBALAMIN) 1000 MCG tablet Take 1,000 mcg by mouth daily.     No facility-administered medications prior to visit.   Past Medical History:  Diagnosis Date  . Asthma   . Confusion 11/08/2016   Formatting of this note might be different from the original. ---Jan 2016-Sleep Study---Obstructive Sleep Apnea  ---Jan 2018-MRI---No acute intracranial abnormality. Partially empty sella. Mild proptosis minimal paranasal sinus  . Cyst on ear    left  . Diabetes mellitus without complication (HCC)   . Dyslipidemia 10/19/2021  . Elevated liver enzymes 01/05/2016   Formatting of this note might be different from the original. US liver  ordered 01/05/16 Findings consistent with nonalcoholic fatty liver disease, lifestyle interventions discussed with the patient 02/02/16 Hep screening negative  . Hyperlipidemia   . Hypertension   . Hypokalemia 09/16/2016  . Memory loss 11/08/2016   Formatting of this note might be different from the original. ---Jan 2016-Sleep Study---Obstructive Sleep Apnea  ---Jan 2018-MRI---No acute intracranial abnormality. Partially empty sella. Mild proptosis minimal paranasal sinus disease  . Sleep apnea    uses CPAP nightly   Past Surgical History:  Procedure Laterality Date  . ANKLE SURGERY Left    achilles tendon   Allergies  Allergen Reactions  . Narcan [Naloxone] Other (See Comments)    Lethargic, sleepy, disoriented, altered mental status  . Hydrocodone-Acetaminophen Nausea Only  . Penicillins Other (See Comments)      Objective:    Physical Exam Vitals and nursing note reviewed.  Constitutional:      General: He is not in acute distress.    Appearance: Normal appearance.  HENT:     Head: Normocephalic.  Cardiovascular:     Rate and Rhythm: Normal rate and regular rhythm.  Pulmonary:     Effort: Pulmonary effort is normal.     Breath sounds: Normal breath sounds.  Musculoskeletal:        General: Normal range of motion.     Cervical back: Normal range of motion.  Skin:    General:  Skin is warm and dry.  Neurological:     Mental Status: He is alert and oriented to person, place, and time.  Psychiatric:        Mood and Affect: Mood normal.  There were no vitals taken for this visit. Wt Readings from Last 3 Encounters:  03/10/22 274 lb 3.2 oz (124.4 kg)  02/02/22 268 lb 4 oz (121.7 kg)  01/30/22 271 lb (122.9 kg)       [name], NP

## 2022-05-24 ENCOUNTER — Encounter: Payer: Self-pay | Admitting: Family

## 2022-05-24 ENCOUNTER — Ambulatory Visit (INDEPENDENT_AMBULATORY_CARE_PROVIDER_SITE_OTHER): Payer: 59 | Admitting: Family

## 2022-05-24 VITALS — BP 134/92 | HR 80 | Temp 98.2°F | Ht 70.0 in | Wt 272.6 lb

## 2022-05-24 DIAGNOSIS — E785 Hyperlipidemia, unspecified: Secondary | ICD-10-CM

## 2022-05-24 DIAGNOSIS — E876 Hypokalemia: Secondary | ICD-10-CM | POA: Diagnosis not present

## 2022-05-24 DIAGNOSIS — I10 Essential (primary) hypertension: Secondary | ICD-10-CM

## 2022-05-24 DIAGNOSIS — E119 Type 2 diabetes mellitus without complications: Secondary | ICD-10-CM | POA: Diagnosis not present

## 2022-05-24 DIAGNOSIS — E1169 Type 2 diabetes mellitus with other specified complication: Secondary | ICD-10-CM | POA: Diagnosis not present

## 2022-05-24 LAB — COMPREHENSIVE METABOLIC PANEL
ALT: 32 U/L (ref 0–53)
AST: 18 U/L (ref 0–37)
Albumin: 4.4 g/dL (ref 3.5–5.2)
Alkaline Phosphatase: 125 U/L — ABNORMAL HIGH (ref 39–117)
BUN: 12 mg/dL (ref 6–23)
CO2: 26 mEq/L (ref 19–32)
Calcium: 9.5 mg/dL (ref 8.4–10.5)
Chloride: 101 mEq/L (ref 96–112)
Creatinine, Ser: 1.01 mg/dL (ref 0.40–1.50)
GFR: 89.81 mL/min (ref >60.00)
Glucose, Bld: 57 mg/dL — ABNORMAL LOW (ref 70–99)
Potassium: 3.2 mEq/L — ABNORMAL LOW (ref 3.5–5.1)
Sodium: 139 mEq/L (ref 135–145)
Total Bilirubin: 0.7 mg/dL (ref 0.2–1.2)
Total Protein: 7.9 g/dL (ref 6.0–8.3)

## 2022-05-24 MED ORDER — TRULICITY 1.5 MG/0.5ML ~~LOC~~ SOAJ: 1.5000 mg | SUBCUTANEOUS | 0 refills | Status: DC

## 2022-05-24 MED ORDER — METFORMIN HCL 1000 MG PO TABS: 1000.0000 mg | ORAL_TABLET | Freq: Two times a day (BID) | ORAL | 1 refills | Status: DC

## 2022-05-24 MED ORDER — ATORVASTATIN CALCIUM 40 MG PO TABS: 40.0000 mg | ORAL_TABLET | Freq: Every day | ORAL | 1 refills | Status: DC

## 2022-05-24 MED ORDER — METOPROLOL SUCCINATE ER 25 MG PO TB24: 25.0000 mg | ORAL_TABLET | Freq: Every evening | ORAL | 1 refills | Status: DC

## 2022-05-24 MED ORDER — OLMESARTAN MEDOXOMIL-HCTZ 20-12.5 MG PO TABS: 1.0000 | ORAL_TABLET | Freq: Every day | ORAL | 1 refills | Status: DC

## 2022-05-24 MED ORDER — AMLODIPINE BESYLATE 10 MG PO TABS: 10.0000 mg | ORAL_TABLET | Freq: Every day | ORAL | 1 refills | Status: DC

## 2022-05-24 MED ORDER — EMPAGLIFLOZIN 25 MG PO TABS: 25.0000 mg | ORAL_TABLET | Freq: Every day | ORAL | 1 refills | Status: DC

## 2022-05-24 NOTE — Assessment & Plan Note (Signed)
   Chronic  reports CBGs in normal range at home  started back to work and he thinks the chemicals he breathes in everyday is affecting his BS as he has sx of hypoglycemia.  advised he take his meter to work and check there and see, may just be the chemicals themselves and he may want to request different work area as he had a sickness years ago r/t same chemicals  checking CMP today  f/u with ENDO before next appt if needed

## 2022-05-24 NOTE — Assessment & Plan Note (Signed)
   Chronic  last lipids checked 09/2021 within normal range  refilling med today, under new insurance - needs med to go to different pharmacy  f/u in 6 mos

## 2022-05-24 NOTE — Patient Instructions (Signed)
It was very nice to see you today!   I will review your lab results via MyChart in a few days.  I have sent all of your refills to [facility]. Remember to buy the baby Aspirin (81mg ) on your own.  Schedule a 6 month follow up visit.  Have a great week :-)      PLEASE NOTE:  If you had any lab tests please let know if you have not heard back within a few days. You may see your results on MyChart before we have a chance to review them but we will give you a call once they are reviewed by [place]. If we ordered any referrals today, please let us know if you have not heard from their office within the next week.

## 2022-05-24 NOTE — Assessment & Plan Note (Signed)
   Chronic  stable on Olmesartan-HCTZ 20-12.5mg , Metoprolol 25mg  qd, Amlodipine 10mg  qd  refilling all meds to go to new pharmacy  f/u in 6 mos or prn

## 2022-05-25 MED ORDER — OLMESARTAN MEDOXOMIL 20 MG PO TABS: 30.0000 mg | ORAL_TABLET | Freq: Every day | ORAL | 0 refills | Status: DC

## 2022-05-25 NOTE — Addendum Note (Signed)
Addended byDulce Sellar on: 05/25/2022 05:56 PM   Modules accepted: Orders

## 2022-05-25 NOTE — Progress Notes (Signed)
Please call Waleed and verify he understands message below, thanks.

## 2022-05-27 ENCOUNTER — Encounter: Payer: Self-pay | Admitting: Family

## 2022-05-27 DIAGNOSIS — E876 Hypokalemia: Secondary | ICD-10-CM

## 2022-05-30 MED ORDER — POTASSIUM CHLORIDE CRYS ER 10 MEQ PO TBCR: 10.0000 meq | EXTENDED_RELEASE_TABLET | Freq: Every day | ORAL | 0 refills | Status: DC

## 2022-05-30 NOTE — Telephone Encounter (Signed)
Pt states you prescribed potassium tablets for them to take and they were never received by pharmacy. I do not see where you made a note of that in their last OV which was 8/30. Please advise.

## 2022-06-01 NOTE — Telephone Encounter (Signed)
Please advise next steps

## 2022-06-02 ENCOUNTER — Other Ambulatory Visit: Payer: Self-pay | Admitting: Family

## 2022-06-02 DIAGNOSIS — I10 Essential (primary) hypertension: Secondary | ICD-10-CM

## 2022-06-02 MED ORDER — OLMESARTAN MEDOXOMIL 20 MG PO TABS: 30.0000 mg | ORAL_TABLET | Freq: Every day | ORAL | 0 refills | Status: DC

## 2022-06-10 DIAGNOSIS — G4733 Obstructive sleep apnea (adult) (pediatric): Secondary | ICD-10-CM | POA: Diagnosis not present

## 2022-06-12 ENCOUNTER — Other Ambulatory Visit (INDEPENDENT_AMBULATORY_CARE_PROVIDER_SITE_OTHER): Payer: 59

## 2022-06-12 DIAGNOSIS — E876 Hypokalemia: Secondary | ICD-10-CM

## 2022-06-12 DIAGNOSIS — Z03818 Encounter for observation for suspected exposure to other biological agents ruled out: Secondary | ICD-10-CM | POA: Diagnosis not present

## 2022-06-12 DIAGNOSIS — R5383 Other fatigue: Secondary | ICD-10-CM | POA: Diagnosis not present

## 2022-06-12 DIAGNOSIS — J3489 Other specified disorders of nose and nasal sinuses: Secondary | ICD-10-CM | POA: Diagnosis not present

## 2022-06-12 DIAGNOSIS — J069 Acute upper respiratory infection, unspecified: Secondary | ICD-10-CM | POA: Diagnosis not present

## 2022-06-12 DIAGNOSIS — I10 Essential (primary) hypertension: Secondary | ICD-10-CM | POA: Diagnosis not present

## 2022-06-12 DIAGNOSIS — R0981 Nasal congestion: Secondary | ICD-10-CM | POA: Diagnosis not present

## 2022-06-12 DIAGNOSIS — Z6839 Body mass index (BMI) 39.0-39.9, adult: Secondary | ICD-10-CM | POA: Diagnosis not present

## 2022-06-12 LAB — BASIC METABOLIC PANEL
BUN: 13 mg/dL (ref 6–23)
CO2: 24 mEq/L (ref 19–32)
Calcium: 9.7 mg/dL (ref 8.4–10.5)
Chloride: 105 mEq/L (ref 96–112)
Creatinine, Ser: 1.02 mg/dL (ref 0.40–1.50)
GFR: 88.73 mL/min (ref >60.00)
Glucose, Bld: 112 mg/dL — ABNORMAL HIGH (ref 70–99)
Potassium: 3.4 mEq/L — ABNORMAL LOW (ref 3.5–5.1)
Sodium: 141 mEq/L (ref 135–145)

## 2022-06-13 NOTE — Progress Notes (Signed)
Your potassium level has improved. How many days did you take the potassium supplement I sent?  And you stopped the Olmesartan w/HCTZ, correct?

## 2022-06-19 ENCOUNTER — Encounter: Payer: Self-pay | Admitting: *Deleted

## 2022-06-20 NOTE — Progress Notes (Unsigned)
Cardiology Office Note:    Date:  06/21/2022   ID:  Miguel Craig, DOB 09/01/1976, MRN [id number]  PCP:  [name], NP   [facility] Providers Cardiologist:  Lenna Sciara, MD Referring MD: [name], NP   Chief Complaint/Reason for Referral: Orthostatic hypotension  ASSESSMENT:    Postural dizziness with presyncope  Type 2 diabetes mellitus without complication, without long-term current use of insulin ([place])  Hypertension associated with diabetes ([place])  Hyperlipidemia associated with type 2 diabetes mellitus ([place]) - Plan: Lipoprotein A (LPA)  BMI 38.0-38.9,adult  PLAN:    In order of problems listed above:  1.  Presyncope: Cardiac evaluation has been reassuring.  Seems to have been secondary to blood pressure issues though his blood pressure when I saw him last was not severely elevated.  Nonetheless he seems to be doing better on these medications.  Follow-up in 1 year or earlier if needed. 2.  Type 2 diabetes: Continue atorvastatin, olmesartan, Jardiance, and start aspirin.   3.  Hypertension: BP is well controlled, continue olmesartan and amlodipine. 4.  Hyperlipidemia: Continue atorvastatin, LDL in January was less than 70.  Check Lp(a). 5.  Elevated BMI: On Trulicity.  Dispo:  Return in about 1 year (around 06/22/2023).     Medication Adjustments/Labs and Tests Ordered: Current medicines are reviewed at length with the patient today.  Concerns regarding medicines are outlined above.   Tests Ordered: Orders Placed This Encounter  Procedures   Lipoprotein A (LPA)    Medication Changes: No orders of the defined types were placed in this encounter.   History of Present Illness:    FOCUSED PROBLEM LIST:   1.  Type 2 diabetes on metformin 2.  Hypertension 3.  Hyperlipidemia 4.  Obstructive sleep apnea on CPAP  March 2023 consultation: The patient is a 46 y.o. male with the indicated medical history here for recommendations regarding  dizziness.  He reported that his head was spinning when he put on his glasses while in class.  This would last for about an hour or so.  By the late afternoon his symptoms basically resolved.  He was seen by neurology and an EEG and MRI were ordered but not yet done.  The patient tells me that he gets dizziness symptoms almost daily.  They can happen when he is just sitting.  When he lays back he feels better.  He has noticed some left ear symptoms as well with little bit of diminished hearing and he tells me a fullness under his left ear.  He denies any chest pain but does get somewhat short of breath with more than moderate exertion.  He denies any peripheral edema.  Has had no paroxysmal nocturnal dyspnea or orthopnea.  He has had no syncope.  We did check orthostatics here with a sitting blood pressure of 140/20mmHg and a standing blood pressure of 130/66mmHg which is a normal response to change in position.  Plan: Echocardiogram, monitor, and start aspirin 81 mg.  Today: His echocardiogram and monitor were reassuring.  His blood pressure medications were adjusted.  This has really helped his dizziness.  He denies any exertional angina, exertional dyspnea, presyncope, syncope, paroxysmal nocturnal dyspnea, orthopnea.  He is working on losing more weight.  He works odd hours.  He feels like the Trulicity did not help as much as Ozempic that he had previously been on but is not covered by his insurance.  He fortunately is not required any emergency room visits or hospitalizations.  Current Medications: Current Meds  Medication Sig   amLODipine (NORVASC) 10 MG tablet Take 1 tablet (10 mg total) by mouth daily.   aspirin EC 81 MG tablet Take 1 tablet (81 mg total) by mouth daily. Swallow whole.   atorvastatin (LIPITOR) 40 MG tablet Take 1 tablet (40 mg total) by mouth daily.   Dulaglutide (TRULICITY) 1.5 MG/0.5ML SOPN Inject 1.5 mg into the skin once a week.   empagliflozin (JARDIANCE) 25 MG  TABS tablet Take 1 tablet (25 mg total) by mouth daily before breakfast.   metFORMIN (GLUCOPHAGE) 1000 MG tablet Take 1 tablet (1,000 mg total) by mouth 2 (two) times daily with a meal.   metoprolol succinate (TOPROL-XL) 25 MG 24 hr tablet Take 1 tablet (25 mg total) by mouth at bedtime.   olmesartan (BENICAR) 20 MG tablet Take 1.5 tablets (30 mg total) by mouth daily.   vitamin B-12 (CYANOCOBALAMIN) 1000 MCG tablet Take 1,000 mcg by mouth daily.     Allergies:    Narcan [naloxone], Hydrocodone-acetaminophen, and Penicillins   Social History:   Social History   Tobacco Use   Smoking status: Some Days    Packs/day: 1.00    Years: 16.00    Total pack years: 16.00    Types: Cigarettes   Smokeless tobacco: Never   Tobacco comments:    Smoking 4-5 cigarettes/day.  01/23/2022 hfb  Vaping Use   Vaping Use: Never used  Substance Use Topics   Alcohol use: Not Currently    Comment: social   Drug use: No     Family Hx: Family History  Problem Relation Age of Onset   Hypertension Mother    Diabetes Father    Hyperlipidemia Father    Colon cancer Neg Hx    Esophageal cancer Neg Hx    Pancreatic cancer Neg Hx    Stomach cancer Neg Hx    Liver disease Neg Hx      Review of Systems:   Please see the history of present illness.    All other systems reviewed and are negative.     EKGs/Labs/Other Test Reviewed:    EKG: March 2023 demonstrates sinus rhythm  Prior CV studies:  TTE 2023: 1. Left ventricular ejection fraction, by estimation, is 50 to 55%. The  left ventricle has low normal function. The left ventricle has no regional  wall motion abnormalities. There is mild left ventricular hypertrophy.  Left ventricular diastolic  parameters were normal.   2. Right ventricular systolic function is normal. The right ventricular  size is normal. Tricuspid regurgitation signal is inadequate for assessing  PA pressure.   3. The mitral valve is normal in structure. No evidence of  mitral valve  regurgitation. No evidence of mitral stenosis.   4. The aortic valve is tricuspid. Aortic valve regurgitation is not  visualized. No aortic stenosis is present.   5. The inferior vena cava is normal in size with greater than 50%  respiratory variability, suggesting right atrial pressure of 3 mmHg.    Monitor 2023: Patch Wear Time:  3 days and 3 hours (2023-03-18T12:07:10-398 to 2023-03-21T16:06:02-399)  Patient had a min HR of 62 bpm, max HR of 123 bpm, and avg HR of 83 bpm. Predominant underlying rhythm was Sinus Rhythm.    EVENTS Isolated SVEs were rare (<1.0%), and no SVE Couplets or SVE Triplets were present. Isolated VEs were rare (<1.0%), VE Couplets were  rare (<1.0%), and no VE Triplets were present. Ventricular Bigeminy was present.  No atrial fibrillation, ventricular tachyarrhythmias, or bradyarrhythmias were detected.     Imaging studies that I have independently reviewed today: Echo and monitor  Recent Labs: 10/19/2021: TSH 1.49 05/24/2022: ALT 32 06/12/2022: BUN 13; Creatinine, Ser 1.02; Potassium 3.4; Sodium 141   Recent Lipid Panel Lab Results  Component Value Date/Time   CHOL 136 10/19/2021 09:21 AM   TRIG 191.0 (H) 10/19/2021 09:21 AM   HDL 32.60 (L) 10/19/2021 09:21 AM   LDLCALC 66 10/19/2021 09:21 AM    Risk Assessment/Calculations:          Physical Exam:    VS:  BP 138/80   Pulse 87   Ht 5\' 10"  (1.778 m)   Wt 274 lb 6.4 oz (124.5 kg)   SpO2 96%   BMI 39.37 kg/m    Wt Readings from Last 3 Encounters:  06/21/22 274 lb 6.4 oz (124.5 kg)  05/24/22 272 lb 9.6 oz (123.7 kg)  03/10/22 274 lb 3.2 oz (124.4 kg)    GENERAL:  No apparent distress, AOx3 HEENT:  No carotid bruits, +2 carotid impulses, no scleral icterus CAR: RRR no murmurs, gallops, rubs, or thrills RES:  Clear to auscultation bilaterally ABD:  Soft, nontender, nondistended, positive bowel sounds x 4 VASC:  +2 radial pulses, +2 carotid pulses, palpable pedal  pulses NEURO:  CN 2-12 grossly intact; motor and sensory grossly intact PSYCH:  No active depression or anxiety EXT:  No edema, ecchymosis, or cyanosis  Signed, 03/12/22, MD  06/21/2022 8:55 AM    [facility] [address] [place], [place], [place]  [place] Phone: [phone]; Fax: [phone]   Note:  This document was prepared using Dragon voice recognition software and may include unintentional dictation errors.

## 2022-06-21 ENCOUNTER — Ambulatory Visit: Payer: 59 | Attending: Internal Medicine | Admitting: Internal Medicine

## 2022-06-21 ENCOUNTER — Encounter: Payer: Self-pay | Admitting: Internal Medicine

## 2022-06-21 VITALS — BP 138/80 | HR 87 | Ht 70.0 in | Wt 274.4 lb

## 2022-06-21 DIAGNOSIS — E1169 Type 2 diabetes mellitus with other specified complication: Secondary | ICD-10-CM | POA: Diagnosis not present

## 2022-06-21 DIAGNOSIS — Z6838 Body mass index (BMI) 38.0-38.9, adult: Secondary | ICD-10-CM

## 2022-06-21 DIAGNOSIS — E785 Hyperlipidemia, unspecified: Secondary | ICD-10-CM

## 2022-06-21 DIAGNOSIS — R42 Dizziness and giddiness: Secondary | ICD-10-CM | POA: Diagnosis not present

## 2022-06-21 DIAGNOSIS — I152 Hypertension secondary to endocrine disorders: Secondary | ICD-10-CM

## 2022-06-21 DIAGNOSIS — E1159 Type 2 diabetes mellitus with other circulatory complications: Secondary | ICD-10-CM

## 2022-06-21 DIAGNOSIS — E119 Type 2 diabetes mellitus without complications: Secondary | ICD-10-CM

## 2022-06-21 DIAGNOSIS — R55 Syncope and collapse: Secondary | ICD-10-CM

## 2022-06-21 NOTE — Patient Instructions (Signed)
Medication Instructions:  No changes *If you need a refill on your cardiac medications before your next appointment, please call your pharmacy*   Lab Work: Today: Lp(a)  If you have labs (blood work) drawn today and your tests are completely normal, you will receive your results only by: [place] (if you have MyChart) OR A paper copy in the mail If you have any lab test that is abnormal or we need to change your treatment, we will call you to review the results.   Testing/Procedures: none   Follow-Up: At [facility], you and your health needs are our priority.  As part of our continuing mission to provide you with exceptional heart care, we have created designated Provider Care Teams.  These Care Teams include your primary Cardiologist (physician) and Advanced Practice Providers (APPs -  Physician Assistants and Nurse Practitioners) who all work together to provide you with the care you need, when you need it.   Your next appointment:   12 month(s)  The format for your next appointment:   In Person  Provider:   Early Osmond, MD      Important Information About Sugar

## 2022-06-22 ENCOUNTER — Telehealth: Payer: Self-pay | Admitting: Internal Medicine

## 2022-06-22 ENCOUNTER — Encounter: Payer: Self-pay | Admitting: Internal Medicine

## 2022-06-22 ENCOUNTER — Other Ambulatory Visit: Payer: Self-pay | Admitting: *Deleted

## 2022-06-22 DIAGNOSIS — E1169 Type 2 diabetes mellitus with other specified complication: Secondary | ICD-10-CM

## 2022-06-22 LAB — LIPOPROTEIN A (LPA): Lipoprotein (a): 89.1 nmol/L — ABNORMAL HIGH (ref <75.0)

## 2022-06-22 NOTE — Telephone Encounter (Signed)
Patient is calling to say that he started back to work recently and he has been having a problem with his blood sugar level being low.  Patient states that he has been sent home due to this problem and work is requiring an office visit (work note) before he can return to work.  I offered 06/29/2022 for appointment, but patient wants sooner appointment.  Patient wants to know what he needs to do next.

## 2022-06-22 NOTE — Telephone Encounter (Signed)
Spoke with the patient to receive further details. Patient states that he is taking Trulicity, Jardiance and Metformin. Asked him if he was able to provide blood sugars readings but he states that he was unable to at the moment. Also informed him that since he isn't on any insulin the risk of low sugar is extremely low. I asked if he would like to give [place] a call back when he is able to get to his meter to provide the readings but he says that he will just try to reach out to his PCP to see if he is able to be seen sooner.

## 2022-06-23 ENCOUNTER — Encounter: Payer: Self-pay | Admitting: Family

## 2022-06-23 ENCOUNTER — Ambulatory Visit (INDEPENDENT_AMBULATORY_CARE_PROVIDER_SITE_OTHER): Payer: 59 | Admitting: Family

## 2022-06-23 VITALS — BP 142/84 | HR 85 | Temp 98.0°F | Ht 70.0 in | Wt 267.4 lb

## 2022-06-23 DIAGNOSIS — E119 Type 2 diabetes mellitus without complications: Secondary | ICD-10-CM

## 2022-06-23 LAB — POCT GLYCOSYLATED HEMOGLOBIN (HGB A1C): Hemoglobin A1C: 5.5 % (ref 4.0–5.6)

## 2022-06-23 MED ORDER — METFORMIN HCL 1000 MG PO TABS: 500.0000 mg | ORAL_TABLET | Freq: Two times a day (BID) | ORAL | 1 refills | Status: DC

## 2022-06-23 NOTE — Progress Notes (Signed)
Patient ID: Miguel Craig, male    DOB: [birth year], 45 y.o.   MRN: [id number]  Chief Complaint  Patient presents with  . Diabetes  . Hypertension    Lipoprotein was low at Cardiology appointment on 9/27.    HPI:      T2DM: Pt is currently maintained on the following medications for diabetes: Farxiga, Trulicity, Metformin Denies polyuria/polydipsia/polyphagia; but reports his BS dropping since back at work, has not actually checked, but he has same sx as when he has been hypoglycemic in past. Home glucose readings range: wnl at home- 70s-80s, but reports he thinks it drops very low at times when he is at work.  Assessment & Plan:  1. Type 2 diabetes mellitus without complication, without long-term current use of insulin (HCC) *** - POCT HgB A1C - metFORMIN (GLUCOPHAGE) 1000 MG tablet; Take 0.5 tablets (500 mg total) by mouth 2 (two) times daily with a meal.  Dispense: 180 tablet; Refill: 1    Subjective:    Outpatient Medications Prior to Visit  Medication Sig Dispense Refill  . amLODipine (NORVASC) 10 MG tablet Take 1 tablet (10 mg total) by mouth daily. 90 tablet 1  . aspirin EC 81 MG tablet Take 1 tablet (81 mg total) by mouth daily. Swallow whole. 90 tablet 3  . atorvastatin (LIPITOR) 40 MG tablet Take 1 tablet (40 mg total) by mouth daily. 90 tablet 1  . Dulaglutide (TRULICITY) 1.5 MG/0.5ML SOPN Inject 1.5 mg into the skin once a week. 6 mL 0  . empagliflozin (JARDIANCE) 25 MG TABS tablet Take 1 tablet (25 mg total) by mouth daily before breakfast. 90 tablet 1  . meclizine (ANTIVERT) 25 MG tablet Take 1 tablet (25 mg total) by mouth 2 (two) times daily as needed for dizziness. 10 tablet 0  . metFORMIN (GLUCOPHAGE) 1000 MG tablet Take 1 tablet (1,000 mg total) by mouth 2 (two) times daily with a meal. 180 tablet 1  . metoprolol succinate (TOPROL-XL) 25 MG 24 hr tablet Take 1 tablet (25 mg total) by mouth at bedtime. 90 tablet 1  . olmesartan (BENICAR) 20 MG tablet Take 1.5  tablets (30 mg total) by mouth daily. 135 tablet 0  . potassium chloride (KLOR-CON M) 10 MEQ tablet Take 1 tablet (10 mEq total) by mouth daily before breakfast. 7 tablet 0  . vitamin B-12 (CYANOCOBALAMIN) 1000 MCG tablet Take 1,000 mcg by mouth daily.     No facility-administered medications prior to visit.   Past Medical History:  Diagnosis Date  . Asthma   . Confusion 11/08/2016   Formatting of this note might be different from the original. ---Jan 2016-Sleep Study---Obstructive Sleep Apnea  ---Jan 2018-MRI---No acute intracranial abnormality. Partially empty sella. Mild proptosis minimal paranasal sinus  . Cyst on ear    left  . Diabetes mellitus without complication (HCC)   . Dyslipidemia 10/19/2021  . Elevated liver enzymes 01/05/2016   Formatting of this note might be different from the original. US liver ordered 01/05/16 Findings consistent with nonalcoholic fatty liver disease, lifestyle interventions discussed with the patient 02/02/16 Hep screening negative  . Hyperlipidemia   . Hypertension   . Hypokalemia 09/16/2016  . Memory loss 11/08/2016   Formatting of this note might be different from the original. ---Jan 2016-Sleep Study---Obstructive Sleep Apnea  ---Jan 2018-MRI---No acute intracranial abnormality. Partially empty sella. Mild proptosis minimal paranasal sinus disease  . Sleep apnea    uses CPAP nightly   Past Surgical History:  Procedure Laterality Date  .  ANKLE SURGERY Left    achilles tendon   Allergies  Allergen Reactions  . Narcan [Naloxone] Other (See Comments)    Lethargic, sleepy, disoriented, altered mental status  . Hydrocodone-Acetaminophen Nausea Only  . Penicillins Other (See Comments)      Objective:    Physical Exam Vitals and nursing note reviewed.  Constitutional:      General: He is not in acute distress.    Appearance: Normal appearance.  HENT:     Head: Normocephalic.  Cardiovascular:     Rate and Rhythm: Normal rate and regular  rhythm.  Pulmonary:     Effort: Pulmonary effort is normal.     Breath sounds: Normal breath sounds.  Musculoskeletal:        General: Normal range of motion.     Cervical back: Normal range of motion.  Skin:    General: Skin is warm and dry.  Neurological:     Mental Status: He is alert and oriented to person, place, and time.  Psychiatric:        Mood and Affect: Mood normal.   BP (!) 142/84 (BP Location: Left Arm, Patient Position: Sitting, Cuff Size: Large)   Pulse 85   Temp 98 F (36.7 C) (Temporal)   Ht 5\' 10"  (1.778 m)   Wt 267 lb 6.4 oz (121.3 kg)   SpO2 95%   BMI 38.37 kg/m  Wt Readings from Last 3 Encounters:  06/23/22 267 lb 6.4 oz (121.3 kg)  06/21/22 274 lb 6.4 oz (124.5 kg)  05/24/22 272 lb 9.6 oz (123.7 kg)       [name], NP

## 2022-07-10 DIAGNOSIS — G4733 Obstructive sleep apnea (adult) (pediatric): Secondary | ICD-10-CM | POA: Diagnosis not present

## 2022-07-18 ENCOUNTER — Other Ambulatory Visit: Payer: Self-pay | Admitting: Family

## 2022-07-18 ENCOUNTER — Ambulatory Visit: Payer: 59 | Admitting: Neurology

## 2022-07-18 DIAGNOSIS — E119 Type 2 diabetes mellitus without complications: Secondary | ICD-10-CM

## 2022-07-25 ENCOUNTER — Other Ambulatory Visit: Payer: Self-pay | Admitting: Family

## 2022-07-25 DIAGNOSIS — I10 Essential (primary) hypertension: Secondary | ICD-10-CM

## 2022-08-01 NOTE — Progress Notes (Unsigned)
Name: Miguel Craig  MRN/ DOB: [id number], 1976-08-05   Age/ Sex: 46 y.o., male    PCP: [name], NP   Reason for Endocrinology Evaluation: Type 2 Diabetes Mellitus     Date of Initial Endocrinology Visit: 10/19/2021    PATIENT IDENTIFIER: [name] is a 46 y.o. male with a past medical history of T2DM, HTN, NASH, and OSA on CPAP . The patient presented for initial endocrinology clinic visit on 10/19/2021 for consultative assistance with his diabetes management.    HPI: [name] was    Diagnosed with DM yrs ago.             Hemoglobin A1c has ranged from 5.9% in 2022, peaking at % in 2023. Patient required assistance for hypoglycemia: yes - in 2017   Transitioned care from Dr. Shawnee Knapp 09/2021.   On his initial visit to our clinic his A1c was 6.0 % on Trulicity and Synjary but had to switch Synh=jardy to Metforin and Jardiance due to insurance changes      SUBJECTIVE:   During the last visit (01/30/2022): A1c 6.2 %   Today (08/02/22): Miguel Craig  is here for a follow up on diabetes management. He checked his glucose this morning and yesterday but no reading prior to that until September, 2023, has another meter at work.  The patient has had hypoglycemic episodes since the last clinic visit, while at work. But he did not bring that meter.    Denies nausea, vomiting or diarrhea    HOME DIABETES REGIMEN: Trulicity 1.5 mg weekly  Metformin 1000 mg BID  Jardiance 25 mg weekly     Statin: yes ACE-I/ARB: yes    METER DOWNLOAD SUMMARY: unable to download 85-120   DIABETIC COMPLICATIONS: Microvascular complications:   Denies: CKD retinopathy, meuropathy  Last eye exam: 09/2021  Macrovascular complications:   Denies: CAD, PVD, CVA   PAST HISTORY: Past Medical History:  Past Medical History:  Diagnosis Date   Asthma    Confusion 11/08/2016   Formatting of this note might be different from the original. ---Jan 2016-Sleep  Study---Obstructive Sleep Apnea  ---Jan 2018-MRI---No acute intracranial abnormality. Partially empty sella. Mild proptosis minimal paranasal sinus   Cyst on ear    left   Diabetes mellitus without complication (HCC)    Dyslipidemia 10/19/2021   Elevated liver enzymes 01/05/2016   Formatting of this note might be different from the original. 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 ---Jan 2016-Sleep Study---Obstructive Sleep Apnea  ---Jan 2018-MRI---No acute intracranial abnormality. Partially empty sella. Mild proptosis minimal paranasal sinus disease   Sleep apnea    uses CPAP nightly   Past Surgical History:  Past Surgical History:  Procedure Laterality Date   ANKLE SURGERY Left    achilles tendon    Social History:  reports that he has been smoking cigarettes. He has a 16.00 pack-year smoking history. He has never used smokeless tobacco. He reports that he does not currently use alcohol. He reports that he does not use drugs. Family History:  Family History  Problem Relation Age of Onset   Hypertension Mother    Diabetes Father    Hyperlipidemia Father    Colon cancer Neg Hx    Esophageal cancer Neg Hx  Pancreatic cancer Neg Hx    Stomach cancer Neg Hx    Liver disease Neg Hx      HOME MEDICATIONS: Allergies as of 08/02/2022       Reactions   Narcan [naloxone] Other (See Comments)   Lethargic, sleepy, disoriented, altered mental status   Hydrocodone-acetaminophen Nausea Only   Penicillins Other (See Comments)        Medication List        Accurate as of August 02, 2022  9:31 AM. If you have any questions, ask your nurse or doctor.          amLODipine 10 MG tablet Commonly known as:  NORVASC TAKE 1 TABLET(10 MG) BY MOUTH DAILY   aspirin EC 81 MG tablet Take 1 tablet (81 mg total) by mouth daily. Swallow whole.   atorvastatin 40 MG tablet Commonly known as: LIPITOR Take 1 tablet (40 mg total) by mouth daily.   cyanocobalamin 1000 MCG tablet Commonly known as: VITAMIN B12 Take 1,000 mcg by mouth daily.   empagliflozin 25 MG Tabs tablet Commonly known as: Jardiance Take 1 tablet (25 mg total) by mouth daily before breakfast.   meclizine 25 MG tablet Commonly known as: ANTIVERT Take 1 tablet (25 mg total) by mouth 2 (two) times daily as needed for dizziness.   metFORMIN 1000 MG tablet Commonly known as: GLUCOPHAGE Take 1 tablet (1,000 mg total) by mouth daily with breakfast. What changed:  how much to take when to take this Changed by: Scarlette Shorts, MD   metoprolol succinate 25 MG 24 hr tablet Commonly known as: TOPROL-XL Take 1 tablet (25 mg total) by mouth at bedtime.   olmesartan 20 MG tablet Commonly known as: BENICAR Take 1.5 tablets (30 mg total) by mouth daily.   potassium chloride 10 MEQ tablet Commonly known as: KLOR-CON M Take 1 tablet (10 mEq total) by mouth daily before breakfast.   Trulicity 1.5 MG/0.5ML Sopn Generic drug: Dulaglutide Inject 1.5 mg into the skin once a week.         ALLERGIES: Allergies  Allergen Reactions   Narcan [Naloxone] Other (See Comments)    Lethargic, sleepy, disoriented, altered mental status   Hydrocodone-Acetaminophen Nausea Only   Penicillins Other (See Comments)     REVIEW OF SYSTEMS: A comprehensive ROS was conducted with the patient and is negative except as per HPI     OBJECTIVE:   VITAL SIGNS: BP 130/80 (BP Location: Left Arm, Patient Position: Sitting, Cuff Size: Large)   Pulse 76   Ht 5\' 10"  (1.778 m)   Wt 265 lb (120.2 kg)   SpO2 98%   BMI 38.02 kg/m    PHYSICAL EXAM:  General: Pt appears well and is in NAD  Lungs: Clear with good BS bilat   Heart: RRR   Abdomen:  soft, nontender, without masses or organomegaly palpable  Extremities: No pretibial edema. No lesions.  Neuro: MS is good with appropriate affect, pt is alert and Ox3    DM foot exam: 10/19/2021  The skin of the feet is intact without sores or ulcerations. The pedal pulses are 2+ on right and 2+ on left. The sensation is intact to a screening 5.07, 10 gram monofilament bilaterally    DATA REVIEWED:  Lab Results  Component Value Date   HGBA1C 5.5 06/23/2022   HGBA1C 6.2 (A) 01/30/2022   HGBA1C 6.0 (A) 10/19/2021    Latest Reference Range & Units 06/12/22 08:29  Sodium 135 - 145 mEq/L 141  Potassium 3.5 - 5.1 mEq/L 3.4 (L)  Chloride 96 - 112 mEq/L 105  CO2 19 - 32 mEq/L 24  Glucose 70 - 99 mg/dL 112 (H)  BUN 6 - 23 mg/dL 13  Creatinine 0.40 - 1.50 mg/dL 1.02  Calcium 8.4 - 10.5 mg/dL 9.7  GFR >60.00 mL/min 88.73    ASSESSMENT / PLAN / RECOMMENDATIONS:   1) Type 2 Diabetes Mellitus, Optimally controlled, Without complications - Most recent A1c of 5.5 %. Goal A1c < 7.0 %.    -His A1c has been optimal  -Tolerating medications without issues, we discussed reducing his metformin at this time by 50%.  We will continue to decrease his glycemic agents as long as his A1c remains below 6.5% -The patient endorses episodes of hypoglycemia that only occurs at work.  I did explain to the patient that none of the current glycemic agents have a tendency to cause hypoglycemia, we also discussed the possibility of a faulty meter or glucose strips.  Unfortunately he did not bring that meter that he uses at work for stress test or verify accuracy -I did advise the patient that in the future if he has hypoglycemic episodes he will need to bring the meter so we can evaluate accurately  MEDICATIONS: Continue Trulicity 1.5 mg weekly Decrease metformin 1000 mg 1 tablet daily Continue Jardiance 25 mg daily  EDUCATION / INSTRUCTIONS: BG monitoring instructions: Patient is instructed to check his  blood sugars 1 times a day, fasting . Call [facility] if: BG persistently < 70  I reviewed the Rule of 15 for the treatment of hypoglycemia in detail with the patient. Literature supplied.   2) Diabetic complications:  Eye: Does not have known diabetic retinopathy.  Neuro/ Feet: Does not have known diabetic peripheral neuropathy. Renal: Patient does not have known baseline CKD. He is  on an ACEI/ARB at present.     3) Dyslipidemia :  -LDL at goal, TG slightly elevated, will continue to monitor  Medication Continue atorvastatin 40 mg daily  F/U 6 months    Signed electronically by: Mack Guise, MD  [facility]  [facility] [address] [place], [place] [address] Phone: [phone] FAX: [phone]   CC: [name], [place] [address] Phone: [phone]  Fax: [phone]    Return to Endocrinology clinic as below: Future Appointments  Date Time Provider [place]  11/20/2022  9:00 AM Hilty, Nadean Corwin, MD [facility] None  11/23/2022  8:20 AM [name], NP [facility]

## 2022-08-02 ENCOUNTER — Ambulatory Visit (INDEPENDENT_AMBULATORY_CARE_PROVIDER_SITE_OTHER): Payer: 59 | Admitting: Internal Medicine

## 2022-08-02 ENCOUNTER — Encounter: Payer: Self-pay | Admitting: Internal Medicine

## 2022-08-02 VITALS — BP 130/80 | HR 76 | Ht 70.0 in | Wt 265.0 lb

## 2022-08-02 DIAGNOSIS — E119 Type 2 diabetes mellitus without complications: Secondary | ICD-10-CM | POA: Diagnosis not present

## 2022-08-02 DIAGNOSIS — E785 Hyperlipidemia, unspecified: Secondary | ICD-10-CM

## 2022-08-02 MED ORDER — TRULICITY 1.5 MG/0.5ML ~~LOC~~ SOAJ: 1.5000 mg | SUBCUTANEOUS | 3 refills | Status: DC

## 2022-08-02 MED ORDER — EMPAGLIFLOZIN 25 MG PO TABS: 25.0000 mg | ORAL_TABLET | Freq: Every day | ORAL | 3 refills | Status: DC

## 2022-08-02 MED ORDER — METFORMIN HCL 1000 MG PO TABS: 1000.0000 mg | ORAL_TABLET | Freq: Every day | ORAL | 3 refills | Status: DC

## 2022-08-02 NOTE — Patient Instructions (Signed)
-   Continue Trulicity 1.5 mg weekly  - Decrease Metformin 1000 mg one tablet daily  - Continue Jardiance 25 mg, 1 tablet daily      HOW TO TREAT LOW BLOOD SUGARS (Blood sugar LESS THAN 70 MG/DL) Please follow the RULE OF 15 for the treatment of hypoglycemia treatment (when your (blood sugars are less than 70 mg/dL)   STEP 1: Take 15 grams of carbohydrates when your blood sugar is low, which includes:  3-4 GLUCOSE TABS  OR 3-4 OZ OF JUICE OR REGULAR SODA OR ONE TUBE OF GLUCOSE GEL    STEP 2: RECHECK blood sugar in 15 MINUTES STEP 3: If your blood sugar is still low at the 15 minute recheck --> then, go back to STEP 1 and treat AGAIN with another 15 grams of carbohydrates.

## 2022-08-08 ENCOUNTER — Other Ambulatory Visit: Payer: Self-pay | Admitting: Family

## 2022-08-08 DIAGNOSIS — I10 Essential (primary) hypertension: Secondary | ICD-10-CM

## 2022-08-10 DIAGNOSIS — G4733 Obstructive sleep apnea (adult) (pediatric): Secondary | ICD-10-CM | POA: Diagnosis not present

## 2022-09-07 ENCOUNTER — Encounter: Payer: Self-pay | Admitting: *Deleted

## 2022-09-09 DIAGNOSIS — G4733 Obstructive sleep apnea (adult) (pediatric): Secondary | ICD-10-CM | POA: Diagnosis not present

## 2022-10-05 MED ORDER — TIRZEPATIDE 5 MG/0.5ML ~~LOC~~ SOAJ: 5.0000 mg | SUBCUTANEOUS | 3 refills | Status: DC

## 2022-10-10 DIAGNOSIS — G4733 Obstructive sleep apnea (adult) (pediatric): Secondary | ICD-10-CM | POA: Diagnosis not present

## 2022-10-16 ENCOUNTER — Other Ambulatory Visit (HOSPITAL_COMMUNITY): Payer: Self-pay

## 2022-10-16 ENCOUNTER — Telehealth: Payer: Self-pay | Admitting: Pharmacy Technician

## 2022-10-16 ENCOUNTER — Other Ambulatory Visit: Payer: Self-pay | Admitting: Internal Medicine

## 2022-10-16 DIAGNOSIS — E119 Type 2 diabetes mellitus without complications: Secondary | ICD-10-CM

## 2022-10-16 NOTE — Telephone Encounter (Signed)
Pharmacy Patient Advocate Encounter   Received notification from CMA/Pt/pt advice request msg that prior authorization for [facility] 5mg  is required/requested.   PA submitted on 10/16/22 to (ins) [employer] [place] [employer] via CoverMyMeds [place] has been approved.    Effective dates: 10/16/22 through 10/15/23  Test claim not going through yet.

## 2022-11-10 DIAGNOSIS — G4733 Obstructive sleep apnea (adult) (pediatric): Secondary | ICD-10-CM | POA: Diagnosis not present

## 2022-11-20 ENCOUNTER — Ambulatory Visit: Payer: BC Managed Care – PPO | Admitting: Internal Medicine

## 2022-11-21 ENCOUNTER — Telehealth (HOSPITAL_BASED_OUTPATIENT_CLINIC_OR_DEPARTMENT_OTHER): Payer: BC Managed Care – PPO | Admitting: Internal Medicine

## 2022-11-23 ENCOUNTER — Ambulatory Visit: Payer: 59 | Admitting: Family

## 2022-11-28 NOTE — Progress Notes (Unsigned)
Patient ID: Miguel Craig, male    DOB: January 12, 1976, 47 y.o.   MRN: [id number]  No chief complaint on file.   HPI: Hyperlipidemia: Patient is currently maintained on the following medication for hyperlipidemia: Lipitor. Patient denies myalgia or other side effects. Patient reports good compliance with low fat/low cholesterol diet.  Last lipid panel as follows: Lab Results  Component Value Date   CHOL 136 10/19/2021   HDL 32.60 (L) 10/19/2021   LDLCALC 66 10/19/2021   TRIG 191.0 (H) 10/19/2021   CHOLHDL 4 10/19/2021   Hypertension: Patient is currently maintained on the following medications for blood pressure: Olmesartan-HCTZ, Metoprolol, Amlodipine Patient reports good compliance with blood pressure medications. Patient denies chest pain, headaches, shortness of breath or swelling.  Assessment & Plan:  There are no diagnoses linked to this encounter.  Subjective:    Outpatient Medications Prior to Visit  Medication Sig Dispense Refill   amLODipine (NORVASC) 10 MG tablet TAKE 1 TABLET(10 MG) BY MOUTH DAILY 90 tablet 1   aspirin EC 81 MG tablet Take 1 tablet (81 mg total) by mouth daily. Swallow whole. 90 tablet 3   atorvastatin (LIPITOR) 40 MG tablet Take 1 tablet (40 mg total) by mouth daily. 90 tablet 1   empagliflozin (JARDIANCE) 25 MG TABS tablet Take 1 tablet (25 mg total) by mouth daily before breakfast. 90 tablet 3   meclizine (ANTIVERT) 25 MG tablet Take 1 tablet (25 mg total) by mouth 2 (two) times daily as needed for dizziness. (Patient not taking: Reported on 08/02/2022) 10 tablet 0   metFORMIN (GLUCOPHAGE) 1000 MG tablet TAKE 1 TABLET BY MOUTH TWICE A DAY WITH A MEAL 60 tablet 2   metoprolol succinate (TOPROL-XL) 25 MG 24 hr tablet Take 1 tablet (25 mg total) by mouth at bedtime. 90 tablet 1   olmesartan (BENICAR) 20 MG tablet TAKE 1 AND 1/2 TABLETS DAILY BY MOUTH 135 tablet 1   potassium chloride (KLOR-CON M) 10 MEQ tablet Take 1 tablet (10 mEq total) by mouth daily  before breakfast. (Patient not taking: Reported on 08/02/2022) 7 tablet 0   tirzepatide (MOUNJARO) 5 MG/0.5ML Pen Inject 5 mg into the skin once a week. 6 mL 3   vitamin B-12 (CYANOCOBALAMIN) 1000 MCG tablet Take 1,000 mcg by mouth daily.     No facility-administered medications prior to visit.   Past Medical History:  Diagnosis Date   Asthma    Confusion 11/08/2016   Formatting of this note might be different from the original. ---Jan 2016-Sleep Study---Obstructive Sleep Apnea  ---Jan 2018-MRI---No acute intracranial abnormality. Partially empty sella. Mild proptosis minimal paranasal sinus   Cyst on ear    left   Diabetes mellitus without complication (HCC)    Dyslipidemia 10/19/2021   Elevated liver enzymes 01/05/2016   Formatting of this note might be different from the original. 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 ---Jan 2016-Sleep Study---Obstructive Sleep Apnea  ---Jan 2018-MRI---No acute intracranial abnormality. Partially empty sella. Mild proptosis minimal paranasal sinus disease   Sleep apnea    uses CPAP nightly   Past Surgical History:  Procedure Laterality Date   ANKLE SURGERY Left    achilles tendon   Allergies  Allergen Reactions   Narcan [Naloxone] Other (See Comments)  Lethargic, sleepy, disoriented, altered mental status   Hydrocodone-Acetaminophen Nausea Only   Penicillins Other (See Comments)      Objective:    Physical Exam Vitals and nursing note reviewed.  Constitutional:      General: He is not in acute distress.    Appearance: Normal appearance.  HENT:     Head: Normocephalic.  Cardiovascular:     Rate and Rhythm: Normal rate and regular rhythm.  Pulmonary:     Effort:  Pulmonary effort is normal.     Breath sounds: Normal breath sounds.  Musculoskeletal:        General: Normal range of motion.     Cervical back: Normal range of motion.  Skin:    General: Skin is warm and dry.  Neurological:     Mental Status: He is alert and oriented to person, place, and time.  Psychiatric:        Mood and Affect: Mood normal.    There were no vitals taken for this visit. Wt Readings from Last 3 Encounters:  08/02/22 265 lb (120.2 kg)  06/23/22 267 lb 6.4 oz (121.3 kg)  06/21/22 274 lb 6.4 oz (124.5 kg)       [name], NP

## 2022-11-29 ENCOUNTER — Encounter: Payer: Self-pay | Admitting: Gastroenterology

## 2022-11-29 ENCOUNTER — Other Ambulatory Visit: Payer: Self-pay | Admitting: Family

## 2022-11-29 ENCOUNTER — Ambulatory Visit (INDEPENDENT_AMBULATORY_CARE_PROVIDER_SITE_OTHER): Payer: BC Managed Care – PPO | Admitting: Family

## 2022-11-29 ENCOUNTER — Encounter: Payer: Self-pay | Admitting: Family

## 2022-11-29 VITALS — BP 143/97 | HR 75 | Temp 97.3°F | Ht 70.0 in | Wt 273.4 lb

## 2022-11-29 DIAGNOSIS — E1169 Type 2 diabetes mellitus with other specified complication: Secondary | ICD-10-CM

## 2022-11-29 DIAGNOSIS — I10 Essential (primary) hypertension: Secondary | ICD-10-CM

## 2022-11-29 DIAGNOSIS — E785 Hyperlipidemia, unspecified: Secondary | ICD-10-CM | POA: Diagnosis not present

## 2022-11-29 DIAGNOSIS — E119 Type 2 diabetes mellitus without complications: Secondary | ICD-10-CM

## 2022-11-29 DIAGNOSIS — Z1211 Encounter for screening for malignant neoplasm of colon: Secondary | ICD-10-CM

## 2022-11-29 LAB — LIPID PANEL
Cholesterol: 118 mg/dL (ref 0–200)
HDL: 32.2 mg/dL — ABNORMAL LOW (ref >39.00)
LDL Cholesterol: 64 mg/dL (ref 0–99)
NonHDL: 85.52
Total CHOL/HDL Ratio: 4
Triglycerides: 110 mg/dL (ref 0.0–149.0)
VLDL: 22 mg/dL (ref 0.0–40.0)

## 2022-11-29 LAB — COMPREHENSIVE METABOLIC PANEL
ALT: 26 U/L (ref 0–53)
AST: 18 U/L (ref 0–37)
Albumin: 4.1 g/dL (ref 3.5–5.2)
Alkaline Phosphatase: 130 U/L — ABNORMAL HIGH (ref 39–117)
BUN: 12 mg/dL (ref 6–23)
CO2: 29 mEq/L (ref 19–32)
Calcium: 9.3 mg/dL (ref 8.4–10.5)
Chloride: 103 mEq/L (ref 96–112)
Creatinine, Ser: 1.04 mg/dL (ref 0.40–1.50)
GFR: 86.4 mL/min (ref >60.00)
Glucose, Bld: 88 mg/dL (ref 70–99)
Potassium: 3.4 mEq/L — ABNORMAL LOW (ref 3.5–5.1)
Sodium: 141 mEq/L (ref 135–145)
Total Bilirubin: 0.7 mg/dL (ref 0.2–1.2)
Total Protein: 6.9 g/dL (ref 6.0–8.3)

## 2022-11-29 MED ORDER — ATORVASTATIN CALCIUM 40 MG PO TABS: 40.0000 mg | ORAL_TABLET | Freq: Every day | ORAL | 1 refills | Status: DC

## 2022-11-29 MED ORDER — OLMESARTAN MEDOXOMIL 40 MG PO TABS: 40.0000 mg | ORAL_TABLET | Freq: Every day | ORAL | 1 refills | Status: DC

## 2022-11-29 NOTE — Assessment & Plan Note (Signed)
Chronic, unstable Olmesartan '20mg'$ , Metoprolol '25mg'$  qd, Amlodipine '10mg'$  qd increasing Olmesartan to '40mg'$  f/u in 6 mos or prn

## 2022-11-29 NOTE — Assessment & Plan Note (Signed)
chronic mounjaro '5mg'$ , A1C 5.5, only minimal wt loss so far pt reports eating healthier continue to advise on wt loss strategies including increasing exercise

## 2022-11-29 NOTE — Assessment & Plan Note (Signed)
chronic under ENDO care mounjaro '5mg'$ , Metformin, Jardiance last A1C 5.5

## 2022-11-29 NOTE — Assessment & Plan Note (Signed)
Chronic taking Lipitor '40mg'$  qd rechecking lipids today f/u in 6 mos

## 2022-11-30 NOTE — Progress Notes (Signed)
see mychart msg

## 2022-12-04 ENCOUNTER — Other Ambulatory Visit: Payer: Self-pay | Admitting: Family

## 2022-12-04 DIAGNOSIS — I10 Essential (primary) hypertension: Secondary | ICD-10-CM

## 2022-12-29 DIAGNOSIS — H5213 Myopia, bilateral: Secondary | ICD-10-CM | POA: Diagnosis not present

## 2022-12-29 DIAGNOSIS — E119 Type 2 diabetes mellitus without complications: Secondary | ICD-10-CM | POA: Diagnosis not present

## 2023-01-08 ENCOUNTER — Ambulatory Visit (AMBULATORY_SURGERY_CENTER): Payer: BC Managed Care – PPO

## 2023-01-08 VITALS — Ht 70.0 in | Wt 270.0 lb

## 2023-01-08 DIAGNOSIS — Z1211 Encounter for screening for malignant neoplasm of colon: Secondary | ICD-10-CM

## 2023-01-08 MED ORDER — NA SULFATE-K SULFATE-MG SULF 17.5-3.13-1.6 GM/177ML PO SOLN
1.0000 | Freq: Once | ORAL | 0 refills | Status: AC
Start: 2023-01-08 — End: 2023-01-08

## 2023-01-08 NOTE — Progress Notes (Signed)

## 2023-01-24 DIAGNOSIS — E119 Type 2 diabetes mellitus without complications: Secondary | ICD-10-CM | POA: Diagnosis not present

## 2023-02-05 ENCOUNTER — Encounter: Payer: Self-pay | Admitting: Gastroenterology

## 2023-02-05 ENCOUNTER — Ambulatory Visit (AMBULATORY_SURGERY_CENTER): Payer: BC Managed Care – PPO | Admitting: Gastroenterology

## 2023-02-05 VITALS — BP 136/91 | HR 64 | Temp 98.9°F | Resp 15 | Ht 70.0 in | Wt 270.0 lb

## 2023-02-05 DIAGNOSIS — D120 Benign neoplasm of cecum: Secondary | ICD-10-CM

## 2023-02-05 DIAGNOSIS — D123 Benign neoplasm of transverse colon: Secondary | ICD-10-CM

## 2023-02-05 DIAGNOSIS — Z1211 Encounter for screening for malignant neoplasm of colon: Secondary | ICD-10-CM | POA: Diagnosis not present

## 2023-02-05 DIAGNOSIS — K635 Polyp of colon: Secondary | ICD-10-CM | POA: Diagnosis not present

## 2023-02-05 DIAGNOSIS — D127 Benign neoplasm of rectosigmoid junction: Secondary | ICD-10-CM | POA: Diagnosis not present

## 2023-02-05 MED ORDER — SODIUM CHLORIDE 0.9 % IV SOLN: 500.0000 mL | Freq: Once | INTRAVENOUS | Status: DC

## 2023-02-05 NOTE — Progress Notes (Signed)
VS by CW  Pt's states no medical or surgical changes since previsit or office visit.  

## 2023-02-05 NOTE — Progress Notes (Signed)
Called to room to assist during endoscopic procedure.  Patient ID and intended procedure confirmed with present staff. Received instructions for my participation in the procedure from the performing physician.  

## 2023-02-05 NOTE — Progress Notes (Signed)
Uneventful anesthetic. Report to pacu rn. Vss. Care resumed by rn. 

## 2023-02-05 NOTE — Patient Instructions (Signed)
Handout on polyps given.    YOU HAD AN ENDOSCOPIC PROCEDURE TODAY AT THE [facility]:   Refer to the procedure report that was given to you for any specific questions about what was found during the examination.  If the procedure report does not answer your questions, please call your gastroenterologist to clarify.  If you requested that your care partner not be given the details of your procedure findings, then the procedure report has been included in a sealed envelope for you to review at your convenience later.  YOU SHOULD EXPECT: Some feelings of bloating in the abdomen. Passage of more gas than usual.  Walking can help get rid of the air that was put into your GI tract during the procedure and reduce the bloating. If you had a lower endoscopy (such as a colonoscopy or flexible sigmoidoscopy) you may notice spotting of blood in your stool or on the toilet paper. If you underwent a bowel prep for your procedure, you may not have a normal bowel movement for a few days.  Please Note:  You might notice some irritation and congestion in your nose or some drainage.  This is from the oxygen used during your procedure.  There is no need for concern and it should clear up in a day or so.  SYMPTOMS TO REPORT IMMEDIATELY:  Following lower endoscopy (colonoscopy or flexible sigmoidoscopy):  Excessive amounts of blood in the stool  Significant tenderness or worsening of abdominal pains  Swelling of the abdomen that is new, acute  Fever of 100F or higher   For urgent or emergent issues, a gastroenterologist can be reached at any hour by calling [phone]. Do not use MyChart messaging for urgent concerns.    DIET:  We do recommend a small meal at first, but then you may proceed to your regular diet.  Drink plenty of fluids but you should avoid alcoholic beverages for 24 hours.  ACTIVITY:  You should plan to take it easy for the rest of today and you should NOT DRIVE or use heavy  machinery until tomorrow (because of the sedation medicines used during the test).    FOLLOW UP: Our staff will call the number listed on your records the next business day following your procedure.  We will call around 7:15- 8:00 am to check on you and address any questions or concerns that you may have regarding the information given to you following your procedure. If we do not reach you, we will leave a message.     If any biopsies were taken you will be contacted by phone or by letter within the next 1-3 weeks.  Please call us at [phone] if you have not heard about the biopsies in 3 weeks.    SIGNATURES/CONFIDENTIALITY: You and/or your care partner have signed paperwork which will be entered into your electronic medical record.  These signatures attest to the fact that that the information above on your After Visit Summary has been reviewed and is understood.  Full responsibility of the confidentiality of this discharge information lies with you and/or your care-partner.  

## 2023-02-05 NOTE — Progress Notes (Signed)
[facility] History and Physical   Primary Care Physician:  [name], NP   Reason for Procedure:   Colon cancer screening  Plan:    colonoscopy     HPI: Miguel Craig is a 47 y.o. male  here for colonoscopy screening - first time exam.   Patient denies any bowel symptoms at this time. No family history of colon cancer known. Otherwise feels well without any cardiopulmonary symptoms.   I have discussed risks / benefits of anesthesia and endoscopic procedure with Miguel Craig and they wish to proceed with the exams as outlined today.    Past Medical History:  Diagnosis Date   Asthma    Confusion 11/08/2016   Formatting of this note might be different from the original. ---Jan 2016-Sleep Study---Obstructive Sleep Apnea  ---Jan 2018-MRI---No acute intracranial abnormality. Partially empty sella. Mild proptosis minimal paranasal sinus   Cyst on ear    left   Diabetes mellitus without complication (HCC)    Dizziness 10/19/2021   Dyslipidemia 10/19/2021   Elevated liver enzymes 01/05/2016   Formatting of this note might be different from the original. 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 ---Jan 2016-Sleep Study---Obstructive Sleep Apnea  ---Jan 2018-MRI---No acute intracranial abnormality. Partially empty sella. Mild proptosis minimal paranasal sinus disease   Sleep apnea    uses CPAP nightly    Past Surgical History:  Procedure Laterality Date   ANKLE SURGERY Left    achilles tendon    Prior to Admission medications   Medication Sig Start Date End Date Taking? Authorizing Provider  amLODipine (NORVASC) 10 MG tablet TAKE 1 TABLET BY MOUTH EVERY DAY 11/29/22  Yes [name], NP  aspirin EC 81 MG tablet Take 1 tablet (81 mg total) by mouth daily. Swallow whole. 12/07/21  Yes Orbie Pyo, MD  atorvastatin (LIPITOR) 40 MG tablet Take 1 tablet (40 mg total) by mouth daily. 11/29/22  Yes [name], NP  cholecalciferol (VITAMIN D3) 25 MCG (1000 UNIT) tablet Take 1,000 Units by mouth daily.   Yes [provider]  empagliflozin (JARDIANCE) 25 MG TABS tablet Take 1 tablet (25 mg total) by mouth daily before breakfast. 08/02/22  Yes Shamleffer, Konrad Dolores, MD  metFORMIN (GLUCOPHAGE) 1000 MG tablet TAKE 1 TABLET BY MOUTH TWICE A DAY WITH A MEAL Patient taking differently: Take 1,000 mg by mouth daily with breakfast. 10/16/22  Yes Shamleffer, Konrad Dolores, MD  metoprolol succinate (TOPROL-XL) 25 MG 24 hr tablet TAKE 1 TABLET BY MOUTH EVERYDAY AT BEDTIME 12/04/22  Yes Hudnell, Judeth Cornfield, NP  olmesartan (BENICAR) 40 MG tablet Take 1 tablet (40 mg total) by mouth daily. 11/29/22  Yes [name], NP  meclizine (ANTIVERT) 25 MG tablet Take 1 tablet (25 mg total) by mouth 2 (two) times daily as needed for dizziness. Patient not taking: Reported on 01/08/2023 10/19/21   Shamleffer, Konrad Dolores, MD  olmesartan (BENICAR) 20 MG tablet TAKE 1 AND 1/2 TABLETS BY MOUTH DAILY Patient not taking: Reported on 01/08/2023 12/04/22   [name], NP  tirzepatide [facility]) 5 MG/0.5ML Pen Inject 5 mg into the skin once a week. 10/05/22   Shamleffer, Konrad Dolores, MD  vitamin B-12 (CYANOCOBALAMIN) 1000 MCG tablet Take 1,000 mcg by mouth daily. Patient not taking:  Reported on 02/05/2023    [provider]    Current Outpatient Medications  Medication Sig Dispense Refill   amLODipine (NORVASC) 10 MG tablet TAKE 1 TABLET BY MOUTH EVERY DAY 30 tablet 5   aspirin EC 81 MG tablet Take 1 tablet (81 mg total) by mouth daily. Swallow whole. 90 tablet 3   atorvastatin (LIPITOR) 40 MG tablet Take 1 tablet (40 mg total) by mouth daily. 90 tablet 1    cholecalciferol (VITAMIN D3) 25 MCG (1000 UNIT) tablet Take 1,000 Units by mouth daily.     empagliflozin (JARDIANCE) 25 MG TABS tablet Take 1 tablet (25 mg total) by mouth daily before breakfast. 90 tablet 3   metFORMIN (GLUCOPHAGE) 1000 MG tablet TAKE 1 TABLET BY MOUTH TWICE A DAY WITH A MEAL (Patient taking differently: Take 1,000 mg by mouth daily with breakfast.) 60 tablet 2   metoprolol succinate (TOPROL-XL) 25 MG 24 hr tablet TAKE 1 TABLET BY MOUTH EVERYDAY AT BEDTIME 90 tablet 1   olmesartan (BENICAR) 40 MG tablet Take 1 tablet (40 mg total) by mouth daily. 90 tablet 1   meclizine (ANTIVERT) 25 MG tablet Take 1 tablet (25 mg total) by mouth 2 (two) times daily as needed for dizziness. (Patient not taking: Reported on 01/08/2023) 10 tablet 0   olmesartan (BENICAR) 20 MG tablet TAKE 1 AND 1/2 TABLETS BY MOUTH DAILY (Patient not taking: Reported on 01/08/2023) 135 tablet 1   tirzepatide (MOUNJARO) 5 MG/0.5ML Pen Inject 5 mg into the skin once a week. 6 mL 3   vitamin B-12 (CYANOCOBALAMIN) 1000 MCG tablet Take 1,000 mcg by mouth daily. (Patient not taking: Reported on 02/05/2023)     Current Facility-Administered Medications  Medication Dose Route Frequency Provider Last Rate Last Admin   0.9 %  sodium chloride infusion  500 mL Intravenous Once Yannely Kintzel, Willaim Rayas, MD        Allergies as of 02/05/2023 - Review Complete 02/05/2023  Allergen Reaction Noted   Narcan [naloxone] Other (See Comments) 09/20/2016   Hydrocodone-acetaminophen Nausea Only 08/26/2012   Penicillins Other (See Comments) 09/20/2016    Family History  Problem Relation Age of Onset   Hypertension Mother    Diabetes Father    Hyperlipidemia Father    Colon cancer Neg Hx    Esophageal cancer Neg Hx    Pancreatic cancer Neg Hx    Stomach cancer Neg Hx    Liver disease Neg Hx    Colon polyps Neg Hx    Rectal cancer Neg Hx     Social History   Socioeconomic History   Marital status: Single    Spouse name: Not on  file   Number of children: 1   Years of education: Not on file   Highest education level: Not on file  Occupational History   Not on file  Tobacco Use   Smoking status: Some Days    Packs/day: 1.00    Years: 16.00    Additional pack years: 0.00    Total pack years: 16.00    Types: Cigarettes   Smokeless tobacco: Never   Tobacco comments:    Smoking 4-5 cigarettes/day.  01/23/2022 hfb  Vaping Use   Vaping Use: Never used  Substance and Sexual Activity   Alcohol use: Not Currently    Comment: social   Drug use: No   Sexual activity: Not on file  Other Topics Concern   Not on file  Social History Narrative   Right handed  Social Determinants of Health   Financial Resource Strain: Not on file  Food Insecurity: Not on file  Transportation Needs: Not on file  Physical Activity: Not on file  Stress: Not on file  Social Connections: Not on file  Intimate Partner Violence: Not on file    Review of Systems: All other review of systems negative except as mentioned in the HPI.  Physical Exam: Vital signs BP 133/74   Pulse 60   Temp 98.9 F (37.2 C)   Ht 5\' 10"  (1.778 m)   Wt 270 lb (122.5 kg)   SpO2 97%   BMI 38.74 kg/m   General:   Alert,  Well-developed, pleasant and cooperative in NAD Lungs:  Clear throughout to auscultation.   Heart:  Regular rate and rhythm Abdomen:  Soft, nontender and nondistended.   Neuro/Psych:  Alert and cooperative. Normal mood and affect. A and O x 3  Harlin Rain, MD [facility]

## 2023-02-05 NOTE — Op Note (Signed)
[facility] Patient Name: Miguel Craig Procedure Date: 02/05/2023 9:34 AM MRN: [id number] Endoscopist: Viviann Spare P. Adela Lank , MD, [id number] Age: 47 Referring MD:  Date of Birth: [birth year] Gender: Male Account #: [id number] Procedure:                Colonoscopy Indications:              Screening for colorectal malignant neoplasm, This                            is the patient's first colonoscopy Medicines:                Monitored Anesthesia Care Procedure:                Pre-Anesthesia Assessment:                           - Prior to the procedure, a History and Physical                            was performed, and patient medications and                            allergies were reviewed. The patient's tolerance of                            previous anesthesia was also reviewed. The risks                            and benefits of the procedure and the sedation                            options and risks were discussed with the patient.                            All questions were answered, and informed consent                            was obtained. Prior Anticoagulants: The patient has                            taken no anticoagulant or antiplatelet agents. ASA                            Grade Assessment: III - A patient with severe                            systemic disease. After reviewing the risks and                            benefits, the patient was deemed in satisfactory                            condition to undergo the procedure.  After obtaining informed consent, the colonoscope                            was passed under direct vision. Throughout the                            procedure, the patient's blood pressure, pulse, and                            oxygen saturations were monitored continuously. The                            Olympus CF-HQ190L SN [id number] was introduced through                            the anus  and advanced to the the cecum, identified                            by appendiceal orifice and ileocecal valve. The                            colonoscopy was performed without difficulty. The                            patient tolerated the procedure well. The quality                            of the bowel preparation was good. The ileocecal                            valve, appendiceal orifice, and rectum were                            photographed. Scope In: 9:37:12 AM Scope Out: 10:01:54 AM Scope Withdrawal Time: 0 hours 15 minutes 33 seconds  Total Procedure Duration: 0 hours 24 minutes 42 seconds  Findings:                 The perianal and digital rectal examinations were                            normal.                           A 4 mm polyp was found in the cecum. The polyp was                            sessile. The polyp was removed with a cold snare.                            Resection and retrieval were complete.                           A 4 mm polyp was found in the hepatic flexure. The  polyp was sessile. The polyp was removed with a                            cold snare. Resection and retrieval were complete.                           Two flat polyps were found in the transverse colon.                            The polyps were 3 mm in size. These polyps were                            removed with a cold snare. Resection and retrieval                            were complete.                           Two sessile polyps were found in the recto-sigmoid                            colon. The polyps were 3 mm in size. These polyps                            were removed with a cold snare. Resection and                            retrieval were complete.                           The ascending colon revealed excessive looping.                            Abdominal pressure and positional changes utilized                            to achieve  cecal intubation.                           Internal hemorrhoids were found during retroflexion.                           The exam was otherwise without abnormality. Complications:            No immediate complications. Estimated blood loss:                            Minimal. Estimated Blood Loss:     Estimated blood loss was minimal. Impression:               - One 4 mm polyp in the cecum, removed with a cold                            snare. Resected and retrieved.                           -  One 4 mm polyp at the hepatic flexure, removed                            with a cold snare. Resected and retrieved.                           - Two 3 mm polyps in the transverse colon, removed                            with a cold snare. Resected and retrieved.                           - Two 3 mm polyps at the recto-sigmoid colon,                            removed with a cold snare. Resected and retrieved.                           - There was significant looping of the colon.                           - Internal hemorrhoids.                           - The examination was otherwise normal.                           - The GI Genius (intelligent endoscopy module),                            computer-aided polyp detection system powered by AI                            was utilized to detect colorectal polyps through                            enhanced visualization during colonoscopy. Recommendation:           - Patient has a contact number available for                            emergencies. The signs and symptoms of potential                            delayed complications were discussed with the                            patient. Return to normal activities tomorrow.                            Written discharge instructions were provided to the                            patient.                           -  Resume previous diet.                           - Continue present medications.                            - Await pathology results. Viviann Spare P. Adonias Demore, MD 02/05/2023 10:06:35 AM This report has been signed electronically.

## 2023-02-06 ENCOUNTER — Telehealth: Payer: Self-pay

## 2023-02-06 ENCOUNTER — Ambulatory Visit (INDEPENDENT_AMBULATORY_CARE_PROVIDER_SITE_OTHER): Payer: BC Managed Care – PPO | Admitting: Internal Medicine

## 2023-02-06 ENCOUNTER — Encounter: Payer: Self-pay | Admitting: Internal Medicine

## 2023-02-06 VITALS — BP 134/82 | HR 75 | Ht 70.0 in | Wt 277.0 lb

## 2023-02-06 DIAGNOSIS — E785 Hyperlipidemia, unspecified: Secondary | ICD-10-CM

## 2023-02-06 DIAGNOSIS — E1169 Type 2 diabetes mellitus with other specified complication: Secondary | ICD-10-CM

## 2023-02-06 DIAGNOSIS — E119 Type 2 diabetes mellitus without complications: Secondary | ICD-10-CM

## 2023-02-06 LAB — POCT GLYCOSYLATED HEMOGLOBIN (HGB A1C): Hemoglobin A1C: 6.1 % — AB (ref 4.0–5.6)

## 2023-02-06 LAB — POCT GLUCOSE (DEVICE FOR HOME USE): POC Glucose: 114 mg/dl — AB (ref 70–99)

## 2023-02-06 MED ORDER — ATORVASTATIN CALCIUM 40 MG PO TABS: 40.0000 mg | ORAL_TABLET | Freq: Every day | ORAL | 3 refills | Status: DC

## 2023-02-06 MED ORDER — ONETOUCH VERIO VI STRP: 1.0000 | ORAL_STRIP | Freq: Every day | 3 refills | Status: DC

## 2023-02-06 MED ORDER — TIRZEPATIDE 5 MG/0.5ML ~~LOC~~ SOAJ: 5.0000 mg | SUBCUTANEOUS | 3 refills | Status: DC

## 2023-02-06 MED ORDER — EMPAGLIFLOZIN 25 MG PO TABS: 25.0000 mg | ORAL_TABLET | Freq: Every day | ORAL | 3 refills | Status: DC

## 2023-02-06 MED ORDER — ONETOUCH VERIO W/DEVICE KIT: 1.0000 | PACK | Freq: Every day | 0 refills | Status: AC

## 2023-02-06 MED ORDER — METFORMIN HCL 1000 MG PO TABS
1000.0000 mg | ORAL_TABLET | Freq: Every day | ORAL | 3 refills | Status: DC
Start: 2023-02-06 — End: 2023-04-27

## 2023-02-06 NOTE — Patient Instructions (Signed)
-   Mounjaro 5 mg weekly  - Continue  Metformin 1000 mg one tablet daily  - Continue Jardiance 25 mg, 1 tablet daily      HOW TO TREAT LOW BLOOD SUGARS (Blood sugar LESS THAN 70 MG/DL) Please follow the RULE OF 15 for the treatment of hypoglycemia treatment (when your (blood sugars are less than 70 mg/dL)   STEP 1: Take 15 grams of carbohydrates when your blood sugar is low, which includes:  3-4 GLUCOSE TABS  OR 3-4 OZ OF JUICE OR REGULAR SODA OR ONE TUBE OF GLUCOSE GEL    STEP 2: RECHECK blood sugar in 15 MINUTES STEP 3: If your blood sugar is still low at the 15 minute recheck --> then, go back to STEP 1 and treat AGAIN with another 15 grams of carbohydrates.

## 2023-02-06 NOTE — Telephone Encounter (Signed)
  Follow up Call-     02/05/2023    8:49 AM  Call back number  Post procedure Call Back phone  # [phone]  Permission to leave phone message Yes     Patient questions:  Do you have a fever, pain , or abdominal swelling? No. Pain Score  0 *  Have you tolerated food without any problems? Yes.    Have you been able to return to your normal activities? Yes.    Do you have any questions about your discharge instructions: Diet   No. Medications  No. Follow up visit  No.  Do you have questions or concerns about your Care? No.  Actions: * If pain score is 4 or above: No action needed, pain <4.

## 2023-02-06 NOTE — Progress Notes (Signed)
Name: Miguel Craig  MRN/ DOB: [id number], June 05, 1976   Age/ Sex: 47 y.o., male    PCP: [name], NP   Reason for Endocrinology Evaluation: Type 2 Diabetes Mellitus     Date of Initial Endocrinology Visit: 10/19/2021    PATIENT IDENTIFIER: [name] is a 47 y.o. male with a past medical history of T2DM, HTN, NASH, and OSA on CPAP . The patient presented for initial endocrinology clinic visit on 10/19/2021 for consultative assistance with his diabetes management.    HPI: [name] was    Diagnosed with DM yrs ago.             Hemoglobin A1c has ranged from 5.9% in 2022, peaking at % in 2023. Patient required assistance for hypoglycemia: yes - in 2017   Transitioned care from Dr. Shawnee Knapp 09/2021.   On his initial visit to our clinic his A1c was 6.0 % on Trulicity and Synjary but had to switch Synjardy to Metforin and Jardiance due to insurance changes    Switched Trulicity to Miguel Craig 07/2023  SUBJECTIVE:   During the last visit (08/02/2022): A1c 5.5 %   Today (02/06/23): Miguel Craig  is here for a follow up on diabetes management. He has not been able to check glucose   He is bloated after his colonoscopy  Denies nausea, vomiting Has been out of Mounjaro for 4/16th due to shortage of supplies    HOME DIABETES REGIMEN: Mounjaro 5 mg weekly  Metformin 1000 mg daily  Jardiance 25 mg weekly     Statin: yes ACE-I/ARB: yes    METER DOWNLOAD SUMMARY: n/a   DIABETIC COMPLICATIONS: Microvascular complications:   Denies: CKD retinopathy, neuropathy  Last eye exam: 09/2021  Macrovascular complications:   Denies: CAD, PVD, CVA   PAST HISTORY: Past Medical History:  Past Medical History:  Diagnosis Date   Asthma    Confusion 11/08/2016   Formatting of this note might be different from the original. ---Jan 2016-Sleep Study---Obstructive Sleep Apnea  ---Jan 2018-MRI---No acute intracranial abnormality. Partially empty sella. Mild proptosis  minimal paranasal sinus   Cyst on ear    left   Diabetes mellitus without complication (HCC)    Dizziness 10/19/2021   Dyslipidemia 10/19/2021   Elevated liver enzymes 01/05/2016   Formatting of this note might be different from the original. 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 ---Jan 2016-Sleep Study---Obstructive Sleep Apnea  ---Jan 2018-MRI---No acute intracranial abnormality. Partially empty sella. Mild proptosis minimal paranasal sinus disease   Sleep apnea    uses CPAP nightly   Past Surgical History:  Past Surgical History:  Procedure Laterality Date   ANKLE Miguel Left    achilles tendon    Social History:  reports that he has been smoking cigarettes. He has a 16.00 pack-year smoking history. He has never used smokeless tobacco. He reports that he does not currently use alcohol. He reports that he does not use drugs. Family History:  Family History  Problem Relation Age of Onset   Hypertension Mother    Diabetes Father    Hyperlipidemia Father    Colon cancer Neg Hx    Esophageal cancer Neg Hx    Pancreatic cancer Neg Hx    Stomach cancer Neg Hx  Liver disease Neg Hx    Colon polyps Neg Hx    Rectal cancer Neg Hx      HOME MEDICATIONS: Allergies as of 02/06/2023       Reactions   Narcan [naloxone] Other (See Comments)   Lethargic, sleepy, disoriented, altered mental status   Hydrocodone-acetaminophen Nausea Only   Penicillins Other (See Comments)        Medication List        Accurate as of Feb 06, 2023  7:54 AM. If you have any questions, ask your nurse or doctor.          amLODipine 10 MG tablet Commonly known as: NORVASC TAKE 1 TABLET BY MOUTH EVERY DAY   aspirin EC  81 MG tablet Take 1 tablet (81 mg total) by mouth daily. Swallow whole.   atorvastatin 40 MG tablet Commonly known as: LIPITOR Take 1 tablet (40 mg total) by mouth daily.   cholecalciferol 25 MCG (1000 UNIT) tablet Commonly known as: VITAMIN D3 Take 1,000 Units by mouth daily.   cyanocobalamin 1000 MCG tablet Commonly known as: VITAMIN B12 Take 1,000 mcg by mouth daily.   empagliflozin 25 MG Tabs tablet Commonly known as: Jardiance Take 1 tablet (25 mg total) by mouth daily before breakfast.   meclizine 25 MG tablet Commonly known as: ANTIVERT Take 1 tablet (25 mg total) by mouth 2 (two) times daily as needed for dizziness.   metFORMIN 1000 MG tablet Commonly known as: GLUCOPHAGE Take 1 tablet (1,000 mg total) by mouth daily with breakfast. What changed: See the new instructions. Changed by: Scarlette Shorts, MD   metoprolol succinate 25 MG 24 hr tablet Commonly known as: TOPROL-XL TAKE 1 TABLET BY MOUTH EVERYDAY AT BEDTIME   olmesartan 40 MG tablet Commonly known as: BENICAR Take 1 tablet (40 mg total) by mouth daily.   olmesartan 20 MG tablet Commonly known as: BENICAR TAKE 1 AND 1/2 TABLETS BY MOUTH DAILY   OneTouch Verio test strip Generic drug: glucose blood 1 each by Other route daily in the afternoon. Use as instructed Started by: Scarlette Shorts, MD   OneTouch Verio w/Device Kit 1 Device by Does not apply route daily in the afternoon. Started by: Scarlette Shorts, MD   tirzepatide 5 MG/0.5ML Pen Commonly known as: MOUNJARO Inject 5 mg into the skin once a week.         ALLERGIES: Allergies  Allergen Reactions   Narcan [Naloxone] Other (See Comments)    Lethargic, sleepy, disoriented, altered mental status   Hydrocodone-Acetaminophen Nausea Only   Penicillins Other (See Comments)     REVIEW OF SYSTEMS: A comprehensive ROS was conducted with the patient and is negative except as per HPI     OBJECTIVE:   VITAL SIGNS: BP  134/82 (BP Location: Left Arm, Patient Position: Sitting, Cuff Size: Large)   Pulse 75   Ht 5\' 10"  (1.778 m)   Wt 277 lb (125.6 kg)   SpO2 96%   BMI 39.75 kg/m    PHYSICAL EXAM:  General: Pt appears well and is in NAD  Lungs: Clear with good BS bilat   Heart: RRR   Abdomen: soft, nontender  Extremities: No pretibial edema  Neuro: MS is good with appropriate affect, pt is alert and Ox3    DM foot exam: 02/06/2023  The skin of the feet is intact without sores or ulcerations. The pedal pulses are 2+ on right and 2+ on left. The sensation is intact to a screening  5.07, 10 gram monofilament bilaterally    DATA REVIEWED:  Lab Results  Component Value Date   HGBA1C 6.1 (A) 02/06/2023   HGBA1C 5.5 06/23/2022   HGBA1C 6.2 (A) 01/30/2022     Latest Reference Range & Units 11/29/22 08:49  COMPREHENSIVE METABOLIC PANEL  Rpt !  Sodium 135 - 145 mEq/L 141  Potassium 3.5 - 5.1 mEq/L 3.4 (L)  Chloride 96 - 112 mEq/L 103  CO2 19 - 32 mEq/L 29  Glucose 70 - 99 mg/dL 88  BUN 6 - 23 mg/dL 12  Creatinine 0.98 - 1.19 mg/dL 1.47  Calcium 8.4 - 82.9 mg/dL 9.3  Alkaline Phosphatase 39 - 117 U/L 130 (H)  Albumin 3.5 - 5.2 g/dL 4.1  AST 0 - 37 U/L 18  ALT 0 - 53 U/L 26  Total Protein 6.0 - 8.3 g/dL 6.9  Total Bilirubin 0.2 - 1.2 mg/dL 0.7  GFR >56.21 mL/min 86.40  Total CHOL/HDL Ratio  4  Cholesterol 0 - 200 mg/dL 308  HDL Cholesterol >65.78 mg/dL 46.96 (L)  LDL (calc) 0 - 99 mg/dL 64  NonHDL  29.52  Triglycerides 0.0 - 149.0 mg/dL 841.3  VLDL 0.0 - 24.4 mg/dL 01.0    ASSESSMENT / PLAN / RECOMMENDATIONS:   1) Type 2 Diabetes Mellitus, Optimally controlled, Without complications - Most recent A1c of 6.1 %. Goal A1c < 7.0 %.    -His A1c has been optimal  -His insurance is not paying for test strips and costing $80 . I have switched to onetouch verio  - He is having issues with shortage of supplies with all GLP-1 agonists - NO changes   MEDICATIONS: Mounjaro 5 mg  weekly Continue Metformin 1000 mg 1 tablet daily Continue Jardiance 25 mg daily  EDUCATION / INSTRUCTIONS: BG monitoring instructions: Patient is instructed to check his blood sugars 1 times a day, fasting . Call [facility] if: BG persistently < 70  I reviewed the Rule of 15 for the treatment of hypoglycemia in detail with the patient. Literature supplied.   2) Diabetic complications:  Eye: Does not have known diabetic retinopathy.  Neuro/ Feet: Does not have known diabetic peripheral neuropathy. Renal: Patient does not have known baseline CKD. He is  on an ACEI/ARB at present.     3) Dyslipidemia :  - LDL and Tg at goal from March labs   Medication Continue atorvastatin 40 mg daily  F/U 6 months    Signed electronically by: Lyndle Herrlich, MD  [facility]  [facility] [address] [place], [place] [address] Phone: [phone] FAX: [phone]   CC: [name], NP [address] [place] [address] Phone: [phone]  Fax: [phone]    Return to Endocrinology clinic as below: Future Appointments  Date Time Provider Department Center  04/17/2023  8:45 AM Hilty, Lisette Abu, MD [facility] None

## 2023-02-24 DIAGNOSIS — E119 Type 2 diabetes mellitus without complications: Secondary | ICD-10-CM | POA: Diagnosis not present

## 2023-03-22 ENCOUNTER — Other Ambulatory Visit (HOSPITAL_COMMUNITY): Payer: Self-pay

## 2023-03-26 DIAGNOSIS — E119 Type 2 diabetes mellitus without complications: Secondary | ICD-10-CM | POA: Diagnosis not present

## 2023-04-17 ENCOUNTER — Ambulatory Visit: Payer: BC Managed Care – PPO | Attending: Internal Medicine | Admitting: Internal Medicine

## 2023-04-17 ENCOUNTER — Encounter: Payer: Self-pay | Admitting: Internal Medicine

## 2023-04-17 VITALS — Ht 69.0 in | Wt 276.0 lb

## 2023-04-17 DIAGNOSIS — I10 Essential (primary) hypertension: Secondary | ICD-10-CM

## 2023-04-17 DIAGNOSIS — E1169 Type 2 diabetes mellitus with other specified complication: Secondary | ICD-10-CM

## 2023-04-17 DIAGNOSIS — E782 Mixed hyperlipidemia: Secondary | ICD-10-CM

## 2023-04-17 DIAGNOSIS — E7841 Elevated Lipoprotein(a): Secondary | ICD-10-CM | POA: Diagnosis not present

## 2023-04-17 NOTE — Progress Notes (Signed)
Virtual Visit via Telephone Note   Because of Miguel Craig's co-morbid illnesses, he is at least at moderate risk for complications without adequate follow up.  This format is felt to be most appropriate for this patient at this time.  The patient did not have access to video technology/had technical difficulties with video requiring transitioning to audio format only (telephone).  All issues noted in this document were discussed and addressed.  No physical exam could be performed with this format.  Please refer to the patient's chart for his consent to telehealth for [facility].   Date:  04/17/2023   ID:  Miguel Craig, DOB 07/25/1976, MRN [id number] The patient was identified using 2 identifiers.  Evaluation Performed:  New Patient Evaluation  Patient Location:  [address] [place] [address]  Provider location:   [address] [place], [place] [address]  PCP:  [name], NP  Cardiologist:  Orbie Pyo, MD Electrophysiologist:  None   Chief Complaint:  Elevated LP(a)  History of Present Illness:    Miguel Craig is a 47 y.o. male who presents via audio/video conferencing for a telehealth visit today.  Is an 47 year old male kindly referred for evaluation of elevated LP(a).  He has a history of type 2 diabetes, dyslipidemia, hypertension but no known coronary disease.  There was a thought that he may have had a TIA a number of years ago however this was negative and he had a recent MRI of the brain last year which did not show any evidence of old stroke.  He was seen by Dr. Lynnette Caffey for dizziness and orthostatic symptoms.  This may have been related to hypertension.  He says once he had better blood pressure control his symptoms have now resolved.  As part of that workup lipids were drawn including an LP(a).  This was minimally elevated at 89.1 nmol/L.  He has been on atorvastatin 40 mg daily.  His most recent lipid profile in March  2024 showed total cholesterol 118, triglycerides 110, HDL 32 and LDL 64.  Prior CV studies:   The following studies were reviewed today:  Lab work  PMHx:  Past Medical History:  Diagnosis Date   Asthma    Confusion 11/08/2016   Formatting of this note might be different from the original. ---Jan 2016-Sleep Study---Obstructive Sleep Apnea  ---Jan 2018-MRI---No acute intracranial abnormality. Partially empty sella. Mild proptosis minimal paranasal sinus   Cyst on ear    left   Diabetes mellitus without complication (HCC)    Dizziness 10/19/2021   Dyslipidemia 10/19/2021   Elevated liver enzymes 01/05/2016   Formatting of this note might be different from the original. 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 ---Jan 2016-Sleep Study---Obstructive Sleep Apnea  ---Jan 2018-MRI---No acute intracranial abnormality. Partially empty sella. Mild proptosis minimal paranasal sinus disease   Sleep apnea    uses CPAP nightly    Past Surgical History:  Procedure Laterality Date   ANKLE SURGERY Left    achilles tendon    FAMHx:  Family History  Problem Relation Age of Onset   Hypertension Mother    Diabetes Father    Hyperlipidemia Father    Colon cancer Neg Hx    Esophageal cancer Neg  Hx    Pancreatic cancer Neg Hx    Stomach cancer Neg Hx    Liver disease Neg Hx    Colon polyps Neg Hx    Rectal cancer Neg Hx     SOCHx:   reports that he has been smoking cigarettes. He has a 16 pack-year smoking history. He has never used smokeless tobacco. He reports that he does not currently use alcohol. He reports that he does not use drugs.  ALLERGIES:  Allergies  Allergen Reactions   Narcan [Naloxone] Other (See  Comments)    Lethargic, sleepy, disoriented, altered mental status   Hydrocodone-Acetaminophen Nausea Only   Penicillins Other (See Comments)    MEDS:  Current Meds  Medication Sig   amLODipine (NORVASC) 10 MG tablet TAKE 1 TABLET BY MOUTH EVERY DAY   aspirin EC 81 MG tablet Take 1 tablet (81 mg total) by mouth daily. Swallow whole.   atorvastatin (LIPITOR) 40 MG tablet Take 1 tablet (40 mg total) by mouth daily.   Blood Glucose Monitoring Suppl (ONETOUCH VERIO) w/Device KIT 1 Device by Does not apply route daily in the afternoon.   cholecalciferol (VITAMIN D3) 25 MCG (1000 UNIT) tablet Take 1,000 Units by mouth daily.   empagliflozin (JARDIANCE) 25 MG TABS tablet Take 1 tablet (25 mg total) by mouth daily before breakfast.   glucose blood (ONETOUCH VERIO) test strip 1 each by Other route daily in the afternoon. Use as instructed   metFORMIN (GLUCOPHAGE) 1000 MG tablet Take 1 tablet (1,000 mg total) by mouth daily with breakfast.   metoprolol succinate (TOPROL-XL) 25 MG 24 hr tablet TAKE 1 TABLET BY MOUTH EVERYDAY AT BEDTIME   olmesartan (BENICAR) 20 MG tablet TAKE 1 AND 1/2 TABLETS BY MOUTH DAILY   olmesartan (BENICAR) 40 MG tablet Take 1 tablet (40 mg total) by mouth daily.   tirzepatide [facility]) 5 MG/0.5ML Pen Inject 5 mg into the skin once a week.   vitamin B-12 (CYANOCOBALAMIN) 1000 MCG tablet Take 1,000 mcg by mouth daily.     ROS: Pertinent items noted in HPI and remainder of comprehensive ROS otherwise negative.  Labs/Other Tests and Data Reviewed:    Recent Labs: 11/29/2022: ALT 26; BUN 12; Creatinine, Ser 1.04; Potassium 3.4; Sodium 141   Recent Lipid Panel Lab Results  Component Value Date/Time   CHOL 118 11/29/2022 08:49 AM   TRIG 110.0 11/29/2022 08:49 AM   HDL 32.20 (L) 11/29/2022 08:49 AM   CHOLHDL 4 11/29/2022 08:49 AM   LDLCALC 64 11/29/2022 08:49 AM    Wt Readings from Last 3 Encounters:  04/17/23 276 lb (125.2 kg)  02/06/23 277 lb (125.6 kg)  02/05/23  270 lb (122.5 kg)     Exam:    Vital Signs:  Ht 5\' 9"  (1.753 m)   Wt 276 lb (125.2 kg)   BMI 40.76 kg/m    Deferred due to telephone visit  ASSESSMENT & PLAN:    Mixed dyslipidemia, goal LDL less than 70 Elevated LP(a) at 89.1 nmol/L Type 2 diabetes, A1c 6.1% Hypertension  Mr. Bosserman has minimally elevated LP(a) but well treated dyslipidemia with a target LDL less than 70 on high intensity statin.  This meets current guidelines.  I do not think he is a candidate for additional lipid-lowering therapy and given a lack of prior cardiovascular events, it is hard to argue that his LDL goal should be lower.  The LP(a) is minimally elevated and not unusual to be slightly higher in an African-American population.  Based on that, I would not recommend trying to add PCSK9 inhibitor therapy at this point.  We could consider that if he were to have a cardiovascular event or additional risk factors that would necessitate a lower LDL target.  For now would continue high intensity statin therapy.  I am happy to see back on an as-needed basis.  Thanks again for the kind referral.  Patient Risk:   After full review of this patients clinical status, I feel that they are at least moderate risk at this time.  Time:   Today, I have spent 20 minutes with the patient with telehealth technology discussing dyslipidemia, elevated LP(a), diabetes management, orthostatic symptoms and hypertension.     Medication Adjustments/Labs and Tests Ordered: Current medicines are reviewed at length with the patient today.  Concerns regarding medicines are outlined above.   Tests Ordered: No orders of the defined types were placed in this encounter.   Medication Changes: No orders of the defined types were placed in this encounter.   Disposition:  prn  Chrystie Nose, MD, Milagros Loll  [place]  [facility]  Medical Director of the Advanced Lipid Disorders &  Cardiovascular Risk Reduction  Clinic Diplomate of the American Board of Clinical Lipidology Attending Cardiologist  Direct Dial: [phone]  Fax: [phone]  Website:  [email or web address]  Chrystie Nose, MD  04/17/2023 9:14 AM

## 2023-04-17 NOTE — Patient Instructions (Signed)
Medication Instructions:  Your physician recommends that you continue on your current medications as directed. Please refer to the Current Medication list given to you today.   *If you need a refill on your cardiac medications before your next appointment, please call your pharmacy*   Lab Work:  None ordered. If you have labs (blood work) drawn today and your tests are completely normal, you will receive your results only by: MyChart Message (if you have MyChart) OR A paper copy in the mail If you have any lab test that is abnormal or we need to change your treatment, we will call you to review the results.   Testing/Procedures:  None ordered.    Follow-Up: At [facility], you and your health needs are our priority.  As part of our continuing mission to provide you with exceptional heart care, we have created designated Provider Care Teams.  These Care Teams include your primary Cardiologist (physician) and Advanced Practice Providers (APPs -  Physician Assistants and Nurse Practitioners) who all work together to provide you with the care you need, when you need it.  We recommend signing up for the patient portal called "MyChart".  Sign up information is provided on this After Visit Summary.  MyChart is used to connect with patients for Virtual Visits (Telemedicine).  Patients are able to view lab/test results, encounter notes, upcoming appointments, etc.  Non-urgent messages can be sent to your provider as well.   To learn more about what you can do with MyChart, go to [email or web address].    Your next appointment:   As needed     Provider:   Italy Hilty, MD

## 2023-04-26 ENCOUNTER — Other Ambulatory Visit: Payer: Self-pay | Admitting: Internal Medicine

## 2023-04-26 DIAGNOSIS — E119 Type 2 diabetes mellitus without complications: Secondary | ICD-10-CM | POA: Diagnosis not present

## 2023-05-22 ENCOUNTER — Encounter: Payer: Self-pay | Admitting: Adult Health

## 2023-05-22 ENCOUNTER — Ambulatory Visit: Payer: BC Managed Care – PPO | Admitting: Adult Health

## 2023-05-22 VITALS — BP 124/80 | HR 87 | Temp 99.0°F | Ht 70.0 in | Wt 286.0 lb

## 2023-05-22 DIAGNOSIS — G4733 Obstructive sleep apnea (adult) (pediatric): Secondary | ICD-10-CM

## 2023-05-22 NOTE — Assessment & Plan Note (Signed)
Excellent control on CPAP - no changes  Patient education on OSA and CPAP care   Plan  Patient Instructions  Wear CPAP all night long for at least 6hr or more.  Continue to work on weight loss Do not drive if sleepy.  Follow-up with Dr. Vassie Loll in 1 year and As needed

## 2023-05-22 NOTE — Patient Instructions (Addendum)
Wear CPAP all night long for at least 6hr or more.  Continue to work on weight loss Do not drive if sleepy.  Follow-up with Dr. Vassie Loll in 1 year and As needed

## 2023-05-22 NOTE — Progress Notes (Signed)
@Patient  ID: Miguel Craig, male    DOB: Feb 18, 1976, 47 y.o.   MRN: [id number]  Chief Complaint  Patient presents with   Follow-up    Referring provider: [name], NP  HPI: 47 yo male followed for OSA   TEST/EVENTS :  Sleep study 09/2016 AHI -21.5/hr  Optimal control at 17cmH20.    sleep study that showed moderate sleep apnea with AHI 21.5/hour optimal control -CPAP 17 cm H2O.   05/22/2023 Follow up : OSA  Patient returns for a 1 year follow-up.  Patient has moderate obstructive sleep apnea.  He is on nocturnal CPAP.  He says that he is wearing his CPAP every single night.  Feels that he is benefiting from CPAP with decreased daytime sleepiness.  Patient is using a fullface mask.  Uses [employer] for his DME.  Patient says overall he is tolerating CPAP.  Feels that it is working well for him.  CPAP download shows 90% compliance.  Daily average usage at 6 hours.  Patient is on CPAP 18 cm H2O.  AHI 0.5/hour.     Allergies  Allergen Reactions   Narcan [Naloxone] Other (See Comments)    Lethargic, sleepy, disoriented, altered mental status   Hydrocodone-Acetaminophen Nausea Only   Penicillins Other (See Comments)    Immunization History  Administered Date(s) Administered   Influenza Inj Mdck Quad Pf 07/14/2016   Influenza Split 06/25/2014   Influenza, Seasonal, Injecte, Preservative Fre 06/30/2015   Influenza,inj,Quad PF,6+ Mos 06/24/2021   Influenza,inj,quad, With Preservative 07/14/2016, 08/15/2018, 08/28/2019   Influenza-Unspecified 06/25/2014, 06/30/2015, 07/14/2016, 07/09/2022   Janssen (J&J) SARS-COV-2 Vaccination 01/03/2020, 08/30/2020   Pneumococcal Polysaccharide-23 10/09/2014   Pneumococcal-Unspecified 07/09/2022   Tdap 02/08/2015    Past Medical History:  Diagnosis Date   Asthma    Confusion 11/08/2016   Formatting of this note might be different from the original. ---Jan 2016-Sleep Study---Obstructive Sleep Apnea  ---Jan 2018-MRI---No acute  intracranial abnormality. Partially empty sella. Mild proptosis minimal paranasal sinus   Cyst on ear    left   Diabetes mellitus without complication (HCC)    Dizziness 10/19/2021   Dyslipidemia 10/19/2021   Elevated liver enzymes 01/05/2016   Formatting of this note might be different from the original. 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 ---Jan 2016-Sleep Study---Obstructive Sleep Apnea  ---Jan 2018-MRI---No acute intracranial abnormality. Partially empty sella. Mild proptosis minimal paranasal sinus disease   Sleep apnea    uses CPAP nightly    Tobacco History: Social History   Tobacco Use  Smoking Status Some Days   Current packs/day: 1.00   Average packs/day: 1 pack/day for 16.0 years (16.0 ttl pk-yrs)   Types: Cigarettes  Smokeless Tobacco Never  Tobacco Comments   Smoking 4-5 cigarettes/day.  01/23/2022 hfb   Ready to quit: Not Answered Counseling given: Not Answered Tobacco comments: Smoking 4-5 cigarettes/day.  01/23/2022 hfb   Outpatient Medications Prior to Visit  Medication Sig Dispense Refill   amLODipine (NORVASC) 10 MG tablet TAKE 1 TABLET BY MOUTH EVERY DAY 30 tablet 5   aspirin EC 81 MG tablet Take 1 tablet (81 mg total) by mouth daily. Swallow whole. 90 tablet 3   atorvastatin (LIPITOR) 40 MG tablet Take 1 tablet (40 mg total) by mouth daily.  90 tablet 3   Blood Glucose Monitoring Suppl (ONETOUCH VERIO) w/Device KIT 1 Device by Does not apply route daily in the afternoon. 1 kit 0   cholecalciferol (VITAMIN D3) 25 MCG (1000 UNIT) tablet Take 1,000 Units by mouth daily.     empagliflozin (JARDIANCE) 25 MG TABS tablet Take 1 tablet (25 mg total) by mouth daily before breakfast. 90 tablet 3    glucose blood (ONETOUCH VERIO) test strip 1 each by Other route daily in the afternoon. Use as instructed 100 each 3   metFORMIN (GLUCOPHAGE) 1000 MG tablet TAKE 1 TABLET BY MOUTH TWICE A DAY WITH FOOD 180 tablet 1   metoprolol succinate (TOPROL-XL) 25 MG 24 hr tablet TAKE 1 TABLET BY MOUTH EVERYDAY AT BEDTIME 90 tablet 1   olmesartan (BENICAR) 40 MG tablet Take 1 tablet (40 mg total) by mouth daily. 90 tablet 1   tirzepatide (MOUNJARO) 5 MG/0.5ML Pen Inject 5 mg into the skin once a week. 6 mL 3   olmesartan (BENICAR) 20 MG tablet TAKE 1 AND 1/2 TABLETS BY MOUTH DAILY (Patient not taking: Reported on 05/22/2023) 135 tablet 1   vitamin B-12 (CYANOCOBALAMIN) 1000 MCG tablet Take 1,000 mcg by mouth daily. (Patient not taking: Reported on 05/22/2023)     No facility-administered medications prior to visit.     Review of Systems:   Constitutional:   No  weight loss, night sweats,  Fevers, chills, fatigue, or  lassitude.  HEENT:   No headaches,  Difficulty swallowing,  Tooth/dental problems, or  Sore throat,                No sneezing, itching, ear ache, nasal congestion, post nasal drip,   CV:  No chest pain,  Orthopnea, PND, swelling in lower extremities, anasarca, dizziness, palpitations, syncope.   GI  No heartburn, indigestion, abdominal pain, nausea, vomiting, diarrhea, change in bowel habits, loss of appetite, bloody stools.   Resp: No shortness of breath with exertion or at rest.  No excess mucus, no productive cough,  No non-productive cough,  No coughing up of blood.  No change in color of mucus.  No wheezing.  No chest wall deformity  Skin: no rash or lesions.  GU: no dysuria, change in color of urine, no urgency or frequency.  No flank pain, no hematuria   MS:  No joint pain or swelling.  No decreased range of motion.  No back pain.    Physical Exam  BP 124/80 (BP Location: Left Arm, Patient Position: Sitting, Cuff Size: Large)   Pulse 87   Temp 99 F (37.2 C) (Oral)    Ht 5\' 10"  (1.778 m)   Wt 286 lb (129.7 kg)   SpO2 96%   BMI 41.04 kg/m   GEN: A/Ox3; pleasant , NAD, well nourished    HEENT:  [place]/AT, NOSE-clear, THROAT-clear, no lesions, no postnasal drip or exudate noted.   NECK:  Supple w/ fair ROM; no JVD; normal carotid impulses w/o bruits; no thyromegaly or nodules palpated; no lymphadenopathy.    RESP  Clear  P & A; w/o, wheezes/ rales/ or rhonchi. no accessory muscle use, no dullness to percussion  CARD:  RRR, no m/r/g, no peripheral edema, pulses intact, no cyanosis or clubbing.  GI:   Soft & nt; nml bowel sounds; no organomegaly or masses detected.   Musco: Warm bil, no deformities or joint swelling noted.   Neuro: alert, no focal deficits noted.    Skin: Warm, no lesions or rashes  Lab Results:  CBC  BMET   BNP No results found for: "BNP"  ProBNP No results found for: "PROBNP"  Imaging: No results found.  Administration History     None           No data to display          No results found for: "NITRICOXIDE"      Assessment & Plan:   OSA on CPAP Excellent control on CPAP - no changes  Patient education on OSA and CPAP care   Plan  Patient Instructions  Wear CPAP all night long for at least 6hr or more.  Continue to work on weight loss Do not drive if sleepy.  Follow-up with Dr. Vassie Loll in 1 year and As needed        Rubye Oaks, NP 05/22/2023

## 2023-05-27 DIAGNOSIS — E119 Type 2 diabetes mellitus without complications: Secondary | ICD-10-CM | POA: Diagnosis not present

## 2023-06-02 ENCOUNTER — Other Ambulatory Visit: Payer: Self-pay | Admitting: Family

## 2023-06-02 DIAGNOSIS — I10 Essential (primary) hypertension: Secondary | ICD-10-CM

## 2023-06-03 ENCOUNTER — Other Ambulatory Visit: Payer: Self-pay | Admitting: Family

## 2023-06-03 DIAGNOSIS — I10 Essential (primary) hypertension: Secondary | ICD-10-CM

## 2023-06-26 DIAGNOSIS — E119 Type 2 diabetes mellitus without complications: Secondary | ICD-10-CM | POA: Diagnosis not present

## 2023-07-27 DIAGNOSIS — E119 Type 2 diabetes mellitus without complications: Secondary | ICD-10-CM | POA: Diagnosis not present

## 2023-08-07 ENCOUNTER — Ambulatory Visit: Payer: BC Managed Care – PPO | Attending: Cardiology | Admitting: Cardiology

## 2023-08-07 VITALS — BP 150/98 | HR 73 | Resp 16 | Ht 70.0 in | Wt 284.0 lb

## 2023-08-07 DIAGNOSIS — G4733 Obstructive sleep apnea (adult) (pediatric): Secondary | ICD-10-CM | POA: Diagnosis not present

## 2023-08-07 DIAGNOSIS — E785 Hyperlipidemia, unspecified: Secondary | ICD-10-CM

## 2023-08-07 DIAGNOSIS — E119 Type 2 diabetes mellitus without complications: Secondary | ICD-10-CM

## 2023-08-07 DIAGNOSIS — I10 Essential (primary) hypertension: Secondary | ICD-10-CM

## 2023-08-07 DIAGNOSIS — E1159 Type 2 diabetes mellitus with other circulatory complications: Secondary | ICD-10-CM | POA: Diagnosis not present

## 2023-08-07 DIAGNOSIS — E1169 Type 2 diabetes mellitus with other specified complication: Secondary | ICD-10-CM

## 2023-08-07 DIAGNOSIS — I152 Hypertension secondary to endocrine disorders: Secondary | ICD-10-CM

## 2023-08-07 MED ORDER — AMLODIPINE BESYLATE 10 MG PO TABS: 10.0000 mg | ORAL_TABLET | Freq: Every day | ORAL | 3 refills | Status: DC

## 2023-08-07 NOTE — Patient Instructions (Signed)
Medication Instructions:  Your physician recommends that you continue on your current medications as directed. Please refer to the Current Medication list given to you today.  *If you need a refill on your cardiac medications before your next appointment, please call your pharmacy*   Lab Work: None ordered If you have labs (blood work) drawn today and your tests are completely normal, you will receive your results only by: [place] (if you have MyChart) OR A paper copy in the mail If you have any lab test that is abnormal or we need to change your treatment, we will call you to review the results.   Testing/Procedures: None ordered   Follow-Up: At [facility], you and your health needs are our priority.  As part of our continuing mission to provide you with exceptional heart care, we have created designated Provider Care Teams.  These Care Teams include your primary Cardiologist (physician) and Advanced Practice Providers (APPs -  Physician Assistants and Nurse Practitioners) who all work together to provide you with the care you need, when you need it.  We recommend signing up for the patient portal called "MyChart".  Sign up information is provided on this After Visit Summary.  MyChart is used to connect with patients for Virtual Visits (Telemedicine).  Patients are able to view lab/test results, encounter notes, upcoming appointments, etc.  Non-urgent messages can be sent to your provider as well.   To learn more about what you can do with MyChart, go to [email or web address].    Your next appointment:   1 year(s)  Provider:   Early Osmond, MD     Other Instructions

## 2023-08-07 NOTE — Progress Notes (Signed)
Cardiology Office Note:   Date:  08/07/2023  ID:  Miguel Craig, DOB 28-Dec-1975, MRN [id number] PCP: [name], NP  [facility] Providers Cardiologist:  Orbie Pyo, MD    History of Present Illness:   Discussed the use of AI scribe software for clinical note transcription with the patient, who gave verbal consent to proceed.     The patient is a 47 year old male with a history of presyncope, type two diabetes, hypertension, hyperlipidemia, and elevated BMI. The patient was referred to cardiology in March 2023 due to episodes of dizziness. Following the initial cardiology visit, the patient underwent a three-day heart monitor test and an echocardiogram, both of which showed no significant abnormalities.  The patient reports a weight gain of approximately 10 pounds since the last visit. Attributes to decreased exercise, diet, and stress. The patient's medication regimen includes Mounjaro for diabetes, and multiple blood pressure medications. However, the patient reports not taking amlodipine for about a month due to issues with prescription refills.  The patient's episodes of dizziness, initially thought to be vertigo, have resolved. The patient attributes this improvement to changes in the timing of medication administration, specifically metoprolol, which was switched from daytime to nighttime administration.  The patient reports no current symptoms of chest pain or shortness of breath. He engages in walking for exercise approximately twice a week for 20-30 minutes, but notes that work commitments have reduced his exercise frequency. The patient denies any current swelling in the legs and feet or palpitations.  The patient acknowledges the need for better self-care, citing multiple family responsibilities as potential stressors, including caring for relatives with Alzheimer's, knee problems, early-stage dementia, and type one diabetes. Despite these challenges, the patient  expresses a commitment to improving his health, including managing his blood pressure and weight.     Today patient denies chest pain, shortness of breath, lower extremity edema, fatigue, palpitations, melena, hematuria, hemoptysis, diaphoresis, weakness, presyncope, syncope, orthopnea, and PND.   Studies Reviewed:    EKG:   EKG Interpretation Date/Time:  Tuesday August 07 2023 10:09:19 EST Ventricular Rate:  73 PR Interval:  178 QRS Duration:  142 QT Interval:  448 QTC Calculation: 493 R Axis:   -26  Text Interpretation: Normal sinus rhythm Right bundle branch block T wave abnormality, consider lateral ischemia Confirmed by Perlie Gold [phone]) on 08/07/2023 10:53:50 AM   Risk Assessment/Calculations:     HYPERTENSION CONTROL Vitals:   08/07/23 1006 08/07/23 1054  BP: (!) 140/88 (!) 150/98    The patient's blood pressure is elevated above target today.  In order to address the patient's elevated BP: Blood pressure will be monitored at home to determine if medication changes need to be made.           Physical Exam:   VS:  BP (!) 150/98   Pulse 73   Resp 16   Ht 5\' 10"  (1.778 m)   Wt 284 lb (128.8 kg)   SpO2 96%   BMI 40.75 kg/m    Wt Readings from Last 3 Encounters:  08/07/23 284 lb (128.8 kg)  05/22/23 286 lb (129.7 kg)  04/17/23 276 lb (125.2 kg)     Physical Exam Vitals reviewed.  Constitutional:      Appearance: Normal appearance.  HENT:     Head: Normocephalic.  Eyes:     Pupils: Pupils are equal, round, and reactive to light.  Cardiovascular:     Rate and Rhythm: Normal rate and regular rhythm.  Pulses: Normal pulses.     Heart sounds: Normal heart sounds.  Pulmonary:     Effort: Pulmonary effort is normal.     Breath sounds: Normal breath sounds.  Musculoskeletal:     Right lower leg: No edema.     Left lower leg: No edema.  Skin:    General: Skin is warm and dry.     Capillary Refill: Capillary refill takes less than 2 seconds.   Neurological:     General: No focal deficit present.     Mental Status: He is alert and oriented to person, place, and time.  Psychiatric:        Mood and Affect: Mood normal.        Behavior: Behavior normal.        Thought Content: Thought content normal.        Judgment: Judgment normal.       ASSESSMENT AND PLAN:    Assessment and Plan    Hypertension Blood pressure is elevated at 140/88, 150/98. Patient has not taken amlodipine for a month due to a lapse in prescription refill. Previous readings at office visits with other specialists were 124/80 and 134/82. Discussed the importance of maintaining blood pressure below 130/80 to reduce heart failure risk. Patient reports no dizziness, chest pain, or shortness of breath. Explained that hypertension increases cardiac workload, potentially leading to heart failure. Plan to restart amlodipine and monitor blood pressure closely. ECG today with RBBB (QRS just under previously but with similar morphology). TWI in V1-V3 expected with RBBB. Also with non-specific TWI in low lateral leads (previously seen). Suspect secondary to some ventricular strain with hypertension. Asymptomatic, no chest pain today or recently.  - Refill amlodipine 10 mg - Continue metoprolol succinate 25 mg at night - Continue olmesartan 40 mg - Monitor blood pressure and follow up with primary care physician and endocrinologist later this week  Type 2 Diabetes Mellitus Patient is on Mounjaro. Switched from Trulicity to [place] due to availability issues. No specific issues discussed. - Continue Mounjaro - Follow up with endocrinologist this week  Hyperlipidemia LDL cholesterol is well-controlled at 64. No changes needed. - Continue atorvastatin 40 mg  Elevated BMI Weight increased from 274 lbs to 284 lbs over the past year. Discussed weight management and regular exercise. Patient acknowledges the need to improve diet and exercise habits. - Encourage  regular exercise - Discuss weight management strategies with primary care physician  Dizziness Patient without symptoms in the last year following change in Metoprolol succinate timing from AM to PM. Continue to monitor.   OSA Patient is compliant with CPAP for sleep apnea  Follow-up - Follow up with endocrinologist later this week - Follow up with primary care physician on Thursday at 9:30 AM - Plan to see cardiologist in one year unless symptoms change.             Signed, Perlie Gold, PA-C

## 2023-08-07 NOTE — Progress Notes (Signed)
Resume Amlodipine (has been out for one month).

## 2023-08-08 DIAGNOSIS — G4733 Obstructive sleep apnea (adult) (pediatric): Secondary | ICD-10-CM | POA: Diagnosis not present

## 2023-08-09 ENCOUNTER — Encounter: Payer: Self-pay | Admitting: Family

## 2023-08-09 ENCOUNTER — Encounter: Payer: Self-pay | Admitting: Internal Medicine

## 2023-08-09 ENCOUNTER — Ambulatory Visit: Payer: BC Managed Care – PPO | Admitting: Family

## 2023-08-09 ENCOUNTER — Ambulatory Visit: Payer: BC Managed Care – PPO | Admitting: Internal Medicine

## 2023-08-09 VITALS — BP 164/105 | HR 66 | Temp 98.0°F | Ht 70.0 in | Wt 281.4 lb

## 2023-08-09 VITALS — BP 150/100 | HR 84 | Ht 70.0 in | Wt 282.0 lb

## 2023-08-09 DIAGNOSIS — E119 Type 2 diabetes mellitus without complications: Secondary | ICD-10-CM

## 2023-08-09 DIAGNOSIS — Z23 Encounter for immunization: Secondary | ICD-10-CM

## 2023-08-09 DIAGNOSIS — I10 Essential (primary) hypertension: Secondary | ICD-10-CM

## 2023-08-09 DIAGNOSIS — I152 Hypertension secondary to endocrine disorders: Secondary | ICD-10-CM

## 2023-08-09 DIAGNOSIS — Z7985 Long-term (current) use of injectable non-insulin antidiabetic drugs: Secondary | ICD-10-CM

## 2023-08-09 DIAGNOSIS — Z7984 Long term (current) use of oral hypoglycemic drugs: Secondary | ICD-10-CM | POA: Diagnosis not present

## 2023-08-09 DIAGNOSIS — E1159 Type 2 diabetes mellitus with other circulatory complications: Secondary | ICD-10-CM

## 2023-08-09 LAB — POCT GLYCOSYLATED HEMOGLOBIN (HGB A1C): Hemoglobin A1C: 6.2 % — AB (ref 4.0–5.6)

## 2023-08-09 LAB — POCT GLUCOSE (DEVICE FOR HOME USE): POC Glucose: 137 mg/dL — AB (ref 70–99)

## 2023-08-09 LAB — MICROALBUMIN / CREATININE URINE RATIO
Creatinine,U: 43.7 mg/dL
Microalb Creat Ratio: 22.3 mg/g (ref 0.0–30.0)
Microalb, Ur: 9.7 mg/dL — ABNORMAL HIGH (ref 0.0–1.9)

## 2023-08-09 LAB — BASIC METABOLIC PANEL
BUN: 13 mg/dL (ref 6–23)
CO2: 31 meq/L (ref 19–32)
Calcium: 9.4 mg/dL (ref 8.4–10.5)
Chloride: 103 meq/L (ref 96–112)
Creatinine, Ser: 1.15 mg/dL (ref 0.40–1.50)
GFR: 76.21 mL/min (ref >60.00)
Glucose, Bld: 106 mg/dL — ABNORMAL HIGH (ref 70–99)
Potassium: 3.5 meq/L (ref 3.5–5.1)
Sodium: 142 meq/L (ref 135–145)

## 2023-08-09 LAB — T4, FREE: Free T4: 0.91 ng/dL (ref 0.60–1.60)

## 2023-08-09 LAB — TSH: TSH: 1.49 u[IU]/mL (ref 0.35–5.50)

## 2023-08-09 MED ORDER — CLONIDINE HCL 0.1 MG PO TABS
0.1000 mg | ORAL_TABLET | Freq: Once | ORAL | Status: AC
  Administered 2023-08-09: 0.1 mg via ORAL

## 2023-08-09 MED ORDER — METFORMIN HCL 1000 MG PO TABS: 1000.0000 mg | ORAL_TABLET | Freq: Every day | ORAL | 3 refills | Status: DC

## 2023-08-09 MED ORDER — EMPAGLIFLOZIN 25 MG PO TABS: 25.0000 mg | ORAL_TABLET | Freq: Every day | ORAL | 3 refills | Status: DC

## 2023-08-09 MED ORDER — TIRZEPATIDE 7.5 MG/0.5ML ~~LOC~~ SOAJ: 7.5000 mg | SUBCUTANEOUS | 3 refills | Status: DC

## 2023-08-09 NOTE — Progress Notes (Signed)
Patient ID: Miguel Craig, male    DOB: 1976-04-16, 47 y.o.   MRN: [id number]  Chief Complaint  Patient presents with   Hypertension    Follow up   Hypertension: Patient is currently maintained on the following medications for blood pressure: Olmesartan-HCTZ, Metoprolol, Amlodipine Patient reports good compliance with blood pressure medications. Patient denies chest pain, headaches, shortness of breath or swelling. Last 3 blood pressure readings in our office are as follows: BP Readings from Last 3 Encounters:  08/09/23 (!) 164/105  08/09/23 (!) 150/100  08/07/23 (!) 150/98    Assessment & Plan:  Essential hypertension Assessment & Plan: Chronic, Unstable Olmesartan 40mg , Metoprolol 25mg  qd, Amlodipine 10mg  qd, no refills needed reports taking Olmesartan at 6:30am & Amlodipine right before coming in office Olmesartan increased last visit, BP still high, seen by ENDO earlier this am and additional labs ordered including checking adrenal/cortisol labs seen by Cardiology 2d ago, no changes to meds, pt was w/o Amlodipine & BP high, but not as high as today pt given Clonidine 0.1mg  tab in office, BP down just slightly later. pt asymptomatic. pt has no BP cuff at home continue to advise on low sodium diet, adequate water intake, get back into exercise routine, weight loss f/u in 1 mo or prn  Orders: -     cloNIDine HCl   Subjective:    Outpatient Medications Prior to Visit  Medication Sig Dispense Refill   amLODipine (NORVASC) 10 MG tablet Take 1 tablet (10 mg total) by mouth daily. 90 tablet 3   aspirin EC 81 MG tablet Take 1 tablet (81 mg total) by mouth daily. Swallow whole. 90 tablet 3   atorvastatin (LIPITOR) 40 MG tablet Take 1 tablet (40 mg total) by mouth daily. 90 tablet 3   Blood Glucose Monitoring Suppl (ONETOUCH VERIO) w/Device KIT 1 Device by Does not apply route daily in the afternoon. 1 kit 0   cholecalciferol (VITAMIN D3) 25 MCG (1000 UNIT) tablet Take  1,000 Units by mouth daily.     empagliflozin (JARDIANCE) 25 MG TABS tablet Take 1 tablet (25 mg total) by mouth daily before breakfast. 90 tablet 3   glucose blood (ONETOUCH VERIO) test strip 1 each by Other route daily in the afternoon. Use as instructed 100 each 3   metFORMIN (GLUCOPHAGE) 1000 MG tablet Take 1 tablet (1,000 mg total) by mouth daily with breakfast. 90 tablet 3   metoprolol succinate (TOPROL-XL) 25 MG 24 hr tablet TAKE 1 TABLET BY MOUTH EVERYDAY AT BEDTIME 90 tablet 1   olmesartan (BENICAR) 40 MG tablet Take 1 tablet (40 mg total) by mouth daily. 90 tablet 1   tirzepatide (MOUNJARO) 7.5 MG/0.5ML Pen Inject 7.5 mg into the skin once a week. 6 mL 3   No facility-administered medications prior to visit.   Past Medical History:  Diagnosis Date   Asthma    Confusion 11/08/2016   Formatting of this note might be different from the original. ---Jan 2016-Sleep Study---Obstructive Sleep Apnea  ---Jan 2018-MRI---No acute intracranial abnormality. Partially empty sella. Mild proptosis minimal paranasal sinus   Cyst on ear    left   Diabetes mellitus without complication (HCC)    Dizziness 10/19/2021   Dyslipidemia 10/19/2021   Elevated liver enzymes 01/05/2016   Formatting of this note might be different from the original. US liver ordered 01/05/16 Findings consistent with nonalcoholic fatty liver disease, lifestyle interventions discussed with the patient 02/02/16 Hep screening negative   Hyperlipidemia    Hypertension  Hypokalemia 09/16/2016   Memory loss 11/08/2016   Formatting of this note might be different from the original. ---Jan 2016-Sleep Study---Obstructive Sleep Apnea  ---Jan 2018-MRI---No acute intracranial abnormality. Partially empty sella. Mild proptosis minimal paranasal sinus disease   Sleep apnea    uses CPAP nightly   Past Surgical History:  Procedure Laterality Date   ANKLE SURGERY Left    achilles tendon   Allergies  Allergen Reactions   Narcan  [Naloxone] Other (See Comments)    Lethargic, sleepy, disoriented, altered mental status   Hydrocodone-Acetaminophen Nausea Only   Penicillins Other (See Comments)      Objective:    Physical Exam Vitals and nursing note reviewed.  Constitutional:      General: He is not in acute distress.    Appearance: Normal appearance. He is obese.  HENT:     Head: Normocephalic.  Cardiovascular:     Rate and Rhythm: Normal rate and regular rhythm.  Pulmonary:     Effort: Pulmonary effort is normal.     Breath sounds: Normal breath sounds.  Musculoskeletal:        General: Normal range of motion.     Cervical back: Normal range of motion.  Skin:    General: Skin is warm and dry.  Neurological:     Mental Status: He is alert and oriented to person, place, and time.  Psychiatric:        Mood and Affect: Mood normal.    BP (!) 164/105   Pulse 66   Temp 98 F (36.7 C) (Temporal)   Ht 5\' 10"  (1.778 m)   Wt 281 lb 6.4 oz (127.6 kg)   SpO2 98%   BMI 40.38 kg/m  Wt Readings from Last 3 Encounters:  08/09/23 281 lb 6.4 oz (127.6 kg)  08/09/23 282 lb (127.9 kg)  08/07/23 284 lb (128.8 kg)      [name], NP

## 2023-08-09 NOTE — Progress Notes (Signed)
Name: Miguel Craig  MRN/ DOB: [id number], 1976-06-18   Age/ Sex: 47 y.o., male    PCP: [name], NP   Reason for Endocrinology Evaluation: Type 2 Diabetes Mellitus     Date of Initial Endocrinology Visit: 10/19/2021    PATIENT IDENTIFIER: [name] is a 47 y.o. male with a past medical history of T2DM, HTN, NASH, and OSA on CPAP . The patient presented for initial endocrinology clinic visit on 10/19/2021 for consultative assistance with his diabetes management.    HPI: [name] was    Diagnosed with DM yrs ago.             Hemoglobin A1c has ranged from 5.9% in 2022, peaking at % in 2023. Patient required assistance for hypoglycemia: yes - in 2017   Transitioned care from Dr. Shawnee Knapp 09/2021.   On his initial visit to our clinic his A1c was 6.0 % on Trulicity and Synjary but had to switch Synjardy to Metforin and Jardiance due to insurance changes    Switched Trulicity to [facility] 07/2023  SUBJECTIVE:   During the last visit (02/06/2023): A1c 6.1 %     Today (08/09/23): Miguel Craig  is here for a follow up on diabetes management. He has been checking glucose occasionally    Patient was evaluated by cardiology for HTN, presyncope and dyslipidemia 07/2023  Denies nausea or vomiting  Denies constipation or diarrhea  Bedtime 9-10 pm  No glucocort   HOME DIABETES REGIMEN: Mounjaro 5 mg weekly  Metformin 1000 mg daily  Jardiance 25 mg weekly     Statin: yes ACE-I/ARB: yes    METER DOWNLOAD SUMMARY: n/a   DIABETIC COMPLICATIONS: Microvascular complications:   Denies: CKD retinopathy, neuropathy  Last eye exam: 09/2021  Macrovascular complications:   Denies: CAD, PVD, CVA   PAST HISTORY: Past Medical History:  Past Medical History:  Diagnosis Date   Asthma    Confusion 11/08/2016   Formatting of this note might be different from the original. ---Jan 2016-Sleep Study---Obstructive Sleep Apnea  ---Jan 2018-MRI---No acute  intracranial abnormality. Partially empty sella. Mild proptosis minimal paranasal sinus   Cyst on ear    left   Diabetes mellitus without complication (HCC)    Dizziness 10/19/2021   Dyslipidemia 10/19/2021   Elevated liver enzymes 01/05/2016   Formatting of this note might be different from the original. 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 ---Jan 2016-Sleep Study---Obstructive Sleep Apnea  ---Jan 2018-MRI---No acute intracranial abnormality. Partially empty sella. Mild proptosis minimal paranasal sinus disease   Sleep apnea    uses CPAP nightly   Past Surgical History:  Past Surgical History:  Procedure Laterality Date   ANKLE SURGERY Left    achilles tendon    Social History:  reports that he has been smoking cigarettes. He has a 16 pack-year smoking history. He has never used smokeless tobacco. He reports that he does not currently use alcohol. He reports that he does not use drugs. Family History:  Family History  Problem Relation Age of Onset   Hypertension Mother    Diabetes Father    Hyperlipidemia Father    Colon cancer Neg Hx    Esophageal cancer Neg Hx    Pancreatic cancer Neg Hx  Stomach cancer Neg Hx    Liver disease Neg Hx    Colon polyps Neg Hx    Rectal cancer Neg Hx      HOME MEDICATIONS: Allergies as of 08/09/2023       Reactions   Narcan [naloxone] Other (See Comments)   Lethargic, sleepy, disoriented, altered mental status   Hydrocodone-acetaminophen Nausea Only   Penicillins Other (See Comments)        Medication List        Accurate as of August 09, 2023  7:35 AM. If you have any questions, ask your nurse or doctor.          amLODipine 10 MG tablet Commonly  known as: NORVASC Take 1 tablet (10 mg total) by mouth daily.   aspirin EC 81 MG tablet Take 1 tablet (81 mg total) by mouth daily. Swallow whole.   atorvastatin 40 MG tablet Commonly known as: LIPITOR Take 1 tablet (40 mg total) by mouth daily.   cholecalciferol 25 MCG (1000 UNIT) tablet Commonly known as: VITAMIN D3 Take 1,000 Units by mouth daily.   empagliflozin 25 MG Tabs tablet Commonly known as: Jardiance Take 1 tablet (25 mg total) by mouth daily before breakfast.   metFORMIN 1000 MG tablet Commonly known as: GLUCOPHAGE TAKE 1 TABLET BY MOUTH TWICE A DAY WITH FOOD   metoprolol succinate 25 MG 24 hr tablet Commonly known as: TOPROL-XL TAKE 1 TABLET BY MOUTH EVERYDAY AT BEDTIME   olmesartan 40 MG tablet Commonly known as: BENICAR Take 1 tablet (40 mg total) by mouth daily.   OneTouch Verio test strip Generic drug: glucose blood 1 each by Other route daily in the afternoon. Use as instructed   OneTouch Verio w/Device Kit 1 Device by Does not apply route daily in the afternoon.   tirzepatide 5 MG/0.5ML Pen Commonly known as: MOUNJARO Inject 5 mg into the skin once a week.         ALLERGIES: Allergies  Allergen Reactions   Narcan [Naloxone] Other (See Comments)    Lethargic, sleepy, disoriented, altered mental status   Hydrocodone-Acetaminophen Nausea Only   Penicillins Other (See Comments)     REVIEW OF SYSTEMS: A comprehensive ROS was conducted with the patient and is negative except as per HPI     OBJECTIVE:   VITAL SIGNS: BP (!) 150/100 (BP Location: Left Arm, Patient Position: Sitting, Cuff Size: Large)   Pulse 84   Ht 5\' 10"  (1.778 m)   Wt 282 lb (127.9 kg)   SpO2 99%   BMI 40.46 kg/m    PHYSICAL EXAM:  General: Pt appears well and is in NAD  Lungs: Clear with good BS bilat   Heart: RRR   Abdomen: soft, nontender  Extremities: Trace  pretibial edema  Neuro: MS is good with appropriate affect, pt is alert and Ox3    DM foot exam:  08/09/2023  The skin of the feet is intact without sores or ulcerations. The pedal pulses are 2+ on right and 2+ on left. The sensation is intact to a screening 5.07, 10 gram monofilament bilaterally    DATA REVIEWED:  Lab Results  Component Value Date   HGBA1C 6.1 (A) 02/06/2023   HGBA1C 5.5 06/23/2022   HGBA1C 6.2 (A) 01/30/2022    Latest Reference Range & Units 08/09/23 07:55  Sodium 135 - 145 mEq/L 142  Potassium 3.5 - 5.1 mEq/L 3.5  Chloride 96 - 112 mEq/L 103  CO2 19 - 32 mEq/L 31  Glucose 70 -  99 mg/dL 865 (H)  BUN 6 - 23 mg/dL 13  Creatinine 7.84 - 6.96 mg/dL 2.95  Calcium 8.4 - 28.4 mg/dL 9.4  GFR >13.24 mL/min 76.21    Latest Reference Range & Units 08/09/23 07:55  TSH 0.35 - 5.50 uIU/mL 1.49  T4,Free(Direct) 0.60 - 1.60 ng/dL 4.01    Latest Reference Range & Units 08/09/23 07:55  Creatinine,U mg/dL 02.7  Microalb, Ur 0.0 - 1.9 mg/dL 9.7 (H)  MICROALB/CREAT RATIO 0.0 - 30.0 mg/g 22.3     ASSESSMENT / PLAN / RECOMMENDATIONS:   1) Type 2 Diabetes Mellitus, Optimally controlled, Without complications - Most recent A1c of 6.2 %. Goal A1c < 7.0 %.    -His A1c has been optimal  - His weight has plateaued and we have opted to increase Mounjaro -BMP, TFT, MA/CR ratio normal    MEDICATIONS: Increase Mounjaro 7.5 mg weekly Continue Metformin 1000 mg 1 tablet daily Continue Jardiance 25 mg daily  EDUCATION / INSTRUCTIONS: BG monitoring instructions: Patient is instructed to check his blood sugars 1 times a day, fasting . Call [facility] if: BG persistently < 70  I reviewed the Rule of 15 for the treatment of hypoglycemia in detail with the patient. Literature supplied.   2) Diabetic complications:  Eye: Does not have known diabetic retinopathy.  Neuro/ Feet: Does not have known diabetic peripheral neuropathy. Renal: Patient does not have known baseline CKD. He is  on an ACEI/ARB at present.     3) Dyslipidemia :  - LDL and Tg at  goal  Medication Continue atorvastatin 40 mg daily   4) HTN :   -This is out of control, patient attributes this to medication nonadherence, as he has been out of amlodipine -Given history of hypokalemia I will screen him for hyperaldosteronism as well as Cushing syndrome   F/U 6 months    Signed electronically by: Lyndle Herrlich, MD  [facility] Endocrinology  [facility] [address] [place] [place], [place] [address] Phone: [phone] FAX: [phone]   CC: [name], NP [address] [place] [address] Phone: [phone]  Fax: [phone]    Return to Endocrinology clinic as below: Future Appointments  Date Time Provider Department Center  08/09/2023  9:30 AM [name], NP [facility]

## 2023-08-09 NOTE — Patient Instructions (Signed)
-   Increase Mounjaro 7.5 mg weekly  - Continue  Metformin 1000 mg one tablet daily  - Continue Jardiance 25 mg, 1 tablet daily      HOW TO TREAT LOW BLOOD SUGARS (Blood sugar LESS THAN 70 MG/DL) Please follow the RULE OF 15 for the treatment of hypoglycemia treatment (when your (blood sugars are less than 70 mg/dL)   STEP 1: Take 15 grams of carbohydrates when your blood sugar is low, which includes:  3-4 GLUCOSE TABS  OR 3-4 OZ OF JUICE OR REGULAR SODA OR ONE TUBE OF GLUCOSE GEL    STEP 2: RECHECK blood sugar in 15 MINUTES STEP 3: If your blood sugar is still low at the 15 minute recheck --> then, go back to STEP 1 and treat AGAIN with another 15 grams of carbohydrates.   SALIVARY CORTISOL COLLECTION INSTRUCTIONS    Precautions:  Please collect sample at Midnight . You will need to do this on 2 nights  No food or fluids 30 minutes prior to collection.  Do not use any creams, lotions on hands, or use steroid inhalers 24- hours prior to collection.  Wash hands carefully.  Avoid any activity that could cause your gums to bleed: including flushing of brushing your teeth.  Kit must not be used in children less than 70 years of age, or a person that is at risk for choking on collection kit.  Instructions for saliva collection:   Rinse mouth thoroughly with water and discard. Do not swallow.  Hold the Salivette at the rim of the suspended insert and remove the stopper.  Remove the swab.  Place swab under tongue until well saturated, approximately 1 minute.   Return the saturated swab to the suspended insert and close the Salivette firmly with the stopper.  Do not remove the tube holding the insert. The Salivette should be sent to the lab with the swab.   Come to the lab and leave the Salivette kit for labeling with your identifying information.   Make sure you refrigerate sample if not bringing to the lab immediately. Try to use cold packs for transportation if available.       HOW TO TREAT LOW BLOOD SUGARS (Blood sugar LESS THAN 70 MG/DL) Please follow the RULE OF 15 for the treatment of hypoglycemia treatment (when your (blood sugars are less than 70 mg/dL)   STEP 1: Take 15 grams of carbohydrates when your blood sugar is low, which includes:  3-4 GLUCOSE TABS  OR 3-4 OZ OF JUICE OR REGULAR SODA OR ONE TUBE OF GLUCOSE GEL    STEP 2: RECHECK blood sugar in 15 MINUTES STEP 3: If your blood sugar is still low at the 15 minute recheck --> then, go back to STEP 1 and treat AGAIN with another 15 grams of carbohydrates.

## 2023-08-09 NOTE — Assessment & Plan Note (Addendum)
Chronic, Unstable Olmesartan 40mg , Metoprolol 25mg  qd, Amlodipine 10mg  qd, no refills needed reports taking Olmesartan at 6:30am & Amlodipine right before coming in office Olmesartan increased last visit, BP still high, seen by ENDO earlier this am and additional labs ordered including checking adrenal/cortisol labs seen by Cardiology 2d ago, no changes to meds, pt was w/o Amlodipine & BP high, but not as high as today pt given Clonidine 0.1mg  tab in office, BP down just slightly later. pt asymptomatic. pt has no BP cuff at home continue to advise on low sodium diet, adequate water intake, get back into exercise routine, weight loss f/u in 1 mo or prn

## 2023-08-16 LAB — ALDOSTERONE + RENIN ACTIVITY W/ RATIO
Aldos/Renin Ratio: >41.3 — ABNORMAL HIGH (ref 0.0–30.0)
Aldosterone: 6.9 ng/dL (ref 0.0–30.0)
Renin Activity, Plasma: <0.167 ng/mL/h — ABNORMAL LOW (ref 0.167–5.380)

## 2023-08-20 ENCOUNTER — Telehealth: Payer: Self-pay | Admitting: Internal Medicine

## 2023-08-20 DIAGNOSIS — Q8789 Other specified congenital malformation syndromes, not elsewhere classified: Secondary | ICD-10-CM | POA: Insufficient documentation

## 2023-08-20 DIAGNOSIS — E269 Hyperaldosteronism, unspecified: Secondary | ICD-10-CM

## 2023-08-20 NOTE — Telephone Encounter (Signed)
Spoke with the patient on 08/20/2023 at 1530 discussed suppressed renin and elevated Aldo/PRA ratio  Will proceed with adrenal imaging

## 2023-08-25 ENCOUNTER — Encounter: Payer: Self-pay | Admitting: Family

## 2023-08-27 ENCOUNTER — Other Ambulatory Visit: Payer: Self-pay

## 2023-08-27 DIAGNOSIS — I10 Essential (primary) hypertension: Secondary | ICD-10-CM

## 2023-08-27 MED ORDER — METOPROLOL SUCCINATE ER 25 MG PO TB24: 25.0000 mg | ORAL_TABLET | Freq: Every day | ORAL | 3 refills | Status: DC

## 2023-08-27 MED ORDER — OLMESARTAN MEDOXOMIL 40 MG PO TABS: 40.0000 mg | ORAL_TABLET | Freq: Every day | ORAL | 3 refills | Status: DC

## 2023-09-06 ENCOUNTER — Encounter: Payer: Self-pay | Admitting: Family

## 2023-09-06 ENCOUNTER — Ambulatory Visit: Payer: BC Managed Care – PPO | Admitting: Family

## 2023-09-06 ENCOUNTER — Ambulatory Visit
Admission: RE | Admit: 2023-09-06 | Discharge: 2023-09-06 | Disposition: A | Discharge status: Home / Self Care | Payer: BC Managed Care – PPO | Source: Ambulatory Visit | Attending: Internal Medicine | Admitting: Internal Medicine

## 2023-09-06 VITALS — BP 143/100 | HR 78 | Wt 271.0 lb

## 2023-09-06 DIAGNOSIS — G4733 Obstructive sleep apnea (adult) (pediatric): Secondary | ICD-10-CM | POA: Diagnosis not present

## 2023-09-06 DIAGNOSIS — Z7985 Long-term (current) use of injectable non-insulin antidiabetic drugs: Secondary | ICD-10-CM | POA: Diagnosis not present

## 2023-09-06 DIAGNOSIS — Z720 Tobacco use: Secondary | ICD-10-CM

## 2023-09-06 DIAGNOSIS — F439 Reaction to severe stress, unspecified: Secondary | ICD-10-CM

## 2023-09-06 DIAGNOSIS — I10 Essential (primary) hypertension: Secondary | ICD-10-CM | POA: Diagnosis not present

## 2023-09-06 DIAGNOSIS — E269 Hyperaldosteronism, unspecified: Secondary | ICD-10-CM | POA: Diagnosis not present

## 2023-09-06 DIAGNOSIS — Z7984 Long term (current) use of oral hypoglycemic drugs: Secondary | ICD-10-CM | POA: Diagnosis not present

## 2023-09-06 DIAGNOSIS — E119 Type 2 diabetes mellitus without complications: Secondary | ICD-10-CM

## 2023-09-06 MED ORDER — AMLODIPINE BESYLATE 10 MG PO TABS
10.0000 mg | ORAL_TABLET | Freq: Every day | ORAL | Status: DC
Start: 2023-09-06 — End: 2024-01-07

## 2023-09-06 MED ORDER — IOPAMIDOL (ISOVUE-300) INJECTION 61%
200.0000 mL | Freq: Once | INTRAVENOUS | Status: AC | PRN
Start: 2023-09-06 — End: 2023-09-06
  Administered 2023-09-06: 100 mL via INTRAVENOUS

## 2023-09-06 MED ORDER — OLMESARTAN MEDOXOMIL 40 MG PO TABS
40.0000 mg | ORAL_TABLET | Freq: Every day | ORAL | Status: DC
Start: 2023-09-06 — End: 2024-01-07

## 2023-09-06 MED ORDER — METOPROLOL SUCCINATE ER 25 MG PO TB24
25.0000 mg | ORAL_TABLET | Freq: Every day | ORAL | Status: DC
Start: 2023-09-06 — End: 2024-01-07

## 2023-09-06 NOTE — Assessment & Plan Note (Addendum)
chronic, Unstable Patient reports occasional smoking, mostly in the evenings & primarily due to stress. -Encourage cessation and offered resources or medication to assist with quitting.

## 2023-09-06 NOTE — Progress Notes (Signed)
Patient ID: Miguel Craig, male    DOB: 1975/12/20, 47 y.o.   MRN: [id number]  Chief Complaint  Patient presents with   Hypertension       Discussed the use of AI scribe software for clinical note transcription with the patient, who gave verbal consent to proceed.  History of Present Illness   The patient, with a history of hypertension, diabetes, and high cholesterol, presents with persistently high blood pressure despite medication. The patient reports feeling "off" in the mornings, but better by the afternoon. The patient admits to occasional smoking, particularly when stressed. The patient is dealing with significant personal stress, including caring for a son with type 1 diabetes, a mother with Alzheimer's, and a father with mobility issues. The patient was previously active, going to the gym three times a week, but has had to stop due to lack of time. The patient reports walking for work and listening to music as stress relief.     Assessment & Plan:     Hypertension - Chronic, Unstable Persistent despite medication. Possible adrenal gland issue contributing to resistant hypertension. Elevated aldosterone and albumin in urine. CT scan scheduled today to further evaluate adrenal glands. -Continue current antihypertensive medications, reflls sent. -Review CT scan results when available. -F/U in 4 months or prn.  Type 2 Diabetes - Chronic, Stable, A1c improved to 6.2. Patient on Mounjaro 7.5mg , Jardiance 25mg  and metformin 1000mg  daily. -Continue current diabetes management with Endo provider.  Tobacco Use - Chronic, Unstable - Patient reports occasional smoking, primarily due to stress. -Encourage cessation and consider offering resources or medication to assist with quitting.  Stress - Chronic - Patient reports high levels of stress due to personal and family health issues. -Advised pt on effects of untreated stress on his health. -Consider resources for stress management,  including counseling or medication, pt to consider.     Subjective:    Outpatient Medications Prior to Visit  Medication Sig Dispense Refill   aspirin EC 81 MG tablet Take 1 tablet (81 mg total) by mouth daily. Swallow whole. 90 tablet 3   atorvastatin (LIPITOR) 40 MG tablet Take 1 tablet (40 mg total) by mouth daily. 90 tablet 3   Blood Glucose Monitoring Suppl (ONETOUCH VERIO) w/Device KIT 1 Device by Does not apply route daily in the afternoon. 1 kit 0   cholecalciferol (VITAMIN D3) 25 MCG (1000 UNIT) tablet Take 1,000 Units by mouth daily.     empagliflozin (JARDIANCE) 25 MG TABS tablet Take 1 tablet (25 mg total) by mouth daily before breakfast. 90 tablet 3   glucose blood (ONETOUCH VERIO) test strip 1 each by Other route daily in the afternoon. Use as instructed 100 each 3   metFORMIN (GLUCOPHAGE) 1000 MG tablet Take 1 tablet (1,000 mg total) by mouth daily with breakfast. 90 tablet 3   tirzepatide (MOUNJARO) 7.5 MG/0.5ML Pen Inject 7.5 mg into the skin once a week. 6 mL 3   amLODipine (NORVASC) 10 MG tablet Take 1 tablet (10 mg total) by mouth daily. 90 tablet 3   metoprolol succinate (TOPROL-XL) 25 MG 24 hr tablet Take 1 tablet (25 mg total) by mouth daily. 90 tablet 3   olmesartan (BENICAR) 40 MG tablet Take 1 tablet (40 mg total) by mouth daily. 90 tablet 3   No facility-administered medications prior to visit.   Past Medical History:  Diagnosis Date   Asthma    Confusion 11/08/2016   Formatting of this note might be different from the original. ---  Jan 2016-Sleep Study---Obstructive Sleep Apnea  ---Jan 2018-MRI---No acute intracranial abnormality. Partially empty sella. Mild proptosis minimal paranasal sinus   Cyst on ear    left   Diabetes mellitus without complication (HCC)    Dizziness 10/19/2021   Dyslipidemia 10/19/2021   Elevated liver enzymes 01/05/2016   Formatting of this note might be different from the original. US liver ordered 01/05/16 Findings consistent with  nonalcoholic fatty liver disease, lifestyle interventions discussed with the patient 02/02/16 Hep screening negative   Hyperlipidemia    Hypertension    Hypokalemia 09/16/2016   Memory loss 11/08/2016   Formatting of this note might be different from the original. ---Jan 2016-Sleep Study---Obstructive Sleep Apnea  ---Jan 2018-MRI---No acute intracranial abnormality. Partially empty sella. Mild proptosis minimal paranasal sinus disease   Sleep apnea    uses CPAP nightly   Past Surgical History:  Procedure Laterality Date   ANKLE SURGERY Left    achilles tendon   Allergies  Allergen Reactions   Narcan [Naloxone] Other (See Comments)    Lethargic, sleepy, disoriented, altered mental status   Hydrocodone-Acetaminophen Nausea Only   Penicillins Other (See Comments)      Objective:    Physical Exam Vitals and nursing note reviewed.  Constitutional:      General: He is not in acute distress.    Appearance: Normal appearance. He is obese.  HENT:     Head: Normocephalic.  Cardiovascular:     Rate and Rhythm: Normal rate and regular rhythm.  Pulmonary:     Effort: Pulmonary effort is normal.     Breath sounds: Normal breath sounds.  Musculoskeletal:        General: Normal range of motion.     Cervical back: Normal range of motion.  Skin:    General: Skin is warm and dry.  Neurological:     Mental Status: He is alert and oriented to person, place, and time.  Psychiatric:        Mood and Affect: Mood normal.    BP (!) 143/100 (BP Location: Left Arm, Patient Position: Sitting, Cuff Size: Large)   Pulse 78   Wt 271 lb (122.9 kg)   SpO2 98%   BMI 38.88 kg/m  Wt Readings from Last 3 Encounters:  09/06/23 271 lb (122.9 kg)  08/09/23 281 lb 6.4 oz (127.6 kg)  08/09/23 282 lb (127.9 kg)      *Extra time ( ) spent with patient today which consisted of chart review, discussing diagnoses, work up, treatment (long discussion on smoking cessation and mental health), answering  questions, and documentation.   [name], NP

## 2023-09-06 NOTE — Assessment & Plan Note (Addendum)
Patient reports high levels of stress due to personal and family health issues. -Advised pt on effects of untreated stress on his health. -Consider resources for stress management, including counseling or medication, pt to consider.

## 2023-09-14 ENCOUNTER — Telehealth: Payer: Self-pay | Admitting: Internal Medicine

## 2023-09-14 NOTE — Telephone Encounter (Signed)
Discussed CT imaging with the patient on 09/14/2023 1630.  Patient with 2 left adrenal adenomas  Patient encouraged to submit saliva cortisol x 2

## 2023-09-20 ENCOUNTER — Other Ambulatory Visit: Payer: Self-pay | Admitting: Internal Medicine

## 2023-09-20 ENCOUNTER — Other Ambulatory Visit: Payer: BC Managed Care – PPO

## 2023-09-20 DIAGNOSIS — D3500 Benign neoplasm of unspecified adrenal gland: Secondary | ICD-10-CM | POA: Diagnosis not present

## 2023-09-27 LAB — CORTISOL, SALIVARY: Cortisol, Saliva: 0.12 ug/dL

## 2023-10-04 ENCOUNTER — Telehealth: Payer: Self-pay | Admitting: Internal Medicine

## 2023-10-04 DIAGNOSIS — R7989 Other specified abnormal findings of blood chemistry: Secondary | ICD-10-CM

## 2023-10-04 MED ORDER — DEXAMETHASONE 1 MG PO TABS: 1.0000 mg | ORAL_TABLET | Freq: Once | ORAL | 0 refills | Status: AC

## 2023-10-04 NOTE — Telephone Encounter (Signed)
 Discussed abnormal saliva cortisol with the patient on 10/04/2023 at 12:30 PM   He was unable to proceed with the second saliva cortisol testing   I have recommended proceeding with dexamethasone  suppression test   A prescription for dexamethasone  1 mg sent to the pharmacy, emphasized the importance of taking this at 11:30 PM the night prior to the test  Emphasized the importance of having a cortisol checkup at 8 AM fasting on January 20 at 8 AM

## 2023-10-15 ENCOUNTER — Other Ambulatory Visit: Payer: BC Managed Care – PPO

## 2023-10-15 DIAGNOSIS — R7989 Other specified abnormal findings of blood chemistry: Secondary | ICD-10-CM | POA: Diagnosis not present

## 2023-10-16 LAB — CORTISOL: Cortisol, Plasma: 1.9 ug/dL — ABNORMAL LOW

## 2023-10-22 ENCOUNTER — Telehealth: Payer: Self-pay | Admitting: Internal Medicine

## 2023-10-22 NOTE — Telephone Encounter (Signed)
Please let the patient know that his cortisol level came back abnormal, and will need to discuss the next steps Can you please schedule the patient for a sooner appointment with me so we can discuss his adrenal gland issues?   Thanks

## 2023-10-22 NOTE — Telephone Encounter (Signed)
Patient aware and scheduled for 11/08/23 at 7:50am

## 2023-10-23 DIAGNOSIS — H524 Presbyopia: Secondary | ICD-10-CM | POA: Diagnosis not present

## 2023-10-23 LAB — HM DIABETES EYE EXAM

## 2023-11-08 ENCOUNTER — Ambulatory Visit: Payer: BC Managed Care – PPO | Admitting: Internal Medicine

## 2023-11-08 ENCOUNTER — Encounter: Payer: Self-pay | Admitting: Internal Medicine

## 2023-11-08 VITALS — BP 120/74 | HR 76 | Ht 70.0 in | Wt 266.0 lb

## 2023-11-08 DIAGNOSIS — D3502 Benign neoplasm of left adrenal gland: Secondary | ICD-10-CM | POA: Diagnosis not present

## 2023-11-08 DIAGNOSIS — E785 Hyperlipidemia, unspecified: Secondary | ICD-10-CM

## 2023-11-08 DIAGNOSIS — E269 Hyperaldosteronism, unspecified: Secondary | ICD-10-CM | POA: Diagnosis not present

## 2023-11-08 DIAGNOSIS — E119 Type 2 diabetes mellitus without complications: Secondary | ICD-10-CM | POA: Diagnosis not present

## 2023-11-08 DIAGNOSIS — Z7985 Long-term (current) use of injectable non-insulin antidiabetic drugs: Secondary | ICD-10-CM

## 2023-11-08 DIAGNOSIS — Z7984 Long term (current) use of oral hypoglycemic drugs: Secondary | ICD-10-CM

## 2023-11-08 LAB — POCT GLYCOSYLATED HEMOGLOBIN (HGB A1C): Hemoglobin A1C: 5.8 % — AB (ref 4.0–5.6)

## 2023-11-08 NOTE — Patient Instructions (Addendum)
-   STOP Metformin  - Continue Mounjaro 7.5 mg weekly  - Continue Jardiance 25 mg, 1 tablet daily   HOW TO TREAT LOW BLOOD SUGARS (Blood sugar LESS THAN 70 MG/DL) Please follow the RULE OF 15 for the treatment of hypoglycemia treatment (when your (blood sugars are less than 70 mg/dL)   STEP 1: Take 15 grams of carbohydrates when your blood sugar is low, which includes:  3-4 GLUCOSE TABS  OR 3-4 OZ OF JUICE OR REGULAR SODA OR ONE TUBE OF GLUCOSE GEL    STEP 2: RECHECK blood sugar in 15 MINUTES STEP 3: If your blood sugar is still low at the 15 minute recheck --> then, go back to STEP 1 and treat AGAIN with another 15 grams of carbohydrates.   24-Hour Urine Collection  You will be collecting your urine for a 24-hour period of time. Your timer starts with your first urine of the morning (For example - If you first pee at 9AM, your timer will start at 9AM) Throw away your first urine of the morning Collect your urine every time you pee for the next 24 hours STOP your urine collection 24 hours after you started the collection (For example - You would stop at 9AM the day after you started)

## 2023-11-08 NOTE — Progress Notes (Unsigned)
Name: Errin Chewning  MRN/ DOB: [id number], 10/07/1975   Age/ Sex: 48 y.o., male    PCP: [name], NP   Reason for Endocrinology Evaluation: Type 2 Diabetes Mellitus     Date of Initial Endocrinology Visit: 10/19/2021    PATIENT IDENTIFIER: Miguel Craig is a 48 y.o. male with a past medical history of T2DM, HTN, NASH, and OSA on CPAP . The patient presented for initial endocrinology clinic visit on 10/19/2021 for consultative assistance with his diabetes management.    HPI: Miguel Craig was    Diagnosed with DM yrs ago.             Hemoglobin A1c has ranged from 5.9% in 2022, peaking at % in 2023. Patient required assistance for hypoglycemia: yes - in 2017   Transitioned care from Dr. Shawnee Knapp 09/2021.   On his initial visit to our clinic his A1c was 6.0 % on Trulicity and Synjary but had to switch Synjardy to Metforin and Jardiance due to insurance changes    Switched Trulicity to [facility] 07/2023  HTN HISTORY: Due to history of uncontrolled HTN and hypokalemia he was screened for Cushing syndrome and hyperaldosteronism.  Patient was noted with suppressed renin activity <0.167 , elevated Aldo/renin ratio >41.3, and an aldosterone level of 6.9.  Saliva cortisol was performed, but the patient was only able to submit 1 sample which was abnormal at 0.12 (reference 0.09) in 08/2023  Dexamethasone suppression test was slightly elevated at 1.8 Mg/DL in 12/979   CT imaging of the abdomen 09/06/2023 revealed 2 left adrenal adenomas measuring up to 11 mm.  No further imaging indicated   SUBJECTIVE:   During the last visit (08/09/2023): A1c 6.2 %     Today (11/08/23): Miguel Craig  is here for a follow up on diabetes management. He has been checking glucose occasionally   Pt continues with weight loss while on Mounjaro Denies nausea or vomiting  Denies constipation or diarrhea  Has occasional fatigue and extremity weakness  Denies chest pain or sob  Denies LE  edema  Has occasional dizzines mainly in the morning , no syncope or pre-syncope  Denies palpitations   HOME DIABETES REGIMEN: Mounjaro 7.5 mg weekly  Metformin 1000 mg daily  Jardiance 25 mg weekly     Statin: yes ACE-I/ARB: yes    METER DOWNLOAD SUMMARY: n/a   DIABETIC COMPLICATIONS: Microvascular complications:   Denies: CKD retinopathy, neuropathy  Last eye exam: 09/2021  Macrovascular complications:   Denies: CAD, PVD, CVA   PAST HISTORY: Past Medical History:  Past Medical History:  Diagnosis Date   Acquired trigger finger of right index finger 10/25/2021   Asthma    Confusion 11/08/2016   Formatting of this note might be different from the original. ---Jan 2016-Sleep Study---Obstructive Sleep Apnea  ---Jan 2018-MRI---No acute intracranial abnormality. Partially empty sella. Mild proptosis minimal paranasal sinus   Cyst on ear    left   Diabetes mellitus without complication (HCC)    Dizziness 10/19/2021   Dyslipidemia 10/19/2021   Elevated liver enzymes 01/05/2016   Formatting of this note might be different from the original. US liver ordered 01/05/16 Findings consistent with nonalcoholic fatty liver disease, lifestyle interventions discussed with the patient 02/02/16 Hep screening negative   Epidermoid cyst of ear 08/26/2018   Hyperlipidemia    Hypertension    Hypokalemia 09/16/2016   Internal and external bleeding hemorrhoids 11/13/2018   Memory loss 11/08/2016   Formatting of this note might be different from the  original. ---Jan 2016-Sleep Study---Obstructive Sleep Apnea  ---Jan 2018-MRI---No acute intracranial abnormality. Partially empty sella. Mild proptosis minimal paranasal sinus disease   Sleep apnea    uses CPAP nightly   Past Surgical History:  Past Surgical History:  Procedure Laterality Date   ANKLE SURGERY Left    achilles tendon    Social History:  reports that he has been smoking cigarettes. He has a 16 pack-year smoking history. He  has never used smokeless tobacco. He reports that he does not currently use alcohol. He reports that he does not use drugs. Family History:  Family History  Problem Relation Age of Onset   Hypertension Mother    Diabetes Father    Hyperlipidemia Father    Colon cancer Neg Hx    Esophageal cancer Neg Hx    Pancreatic cancer Neg Hx    Stomach cancer Neg Hx    Liver disease Neg Hx    Colon polyps Neg Hx    Rectal cancer Neg Hx      HOME MEDICATIONS: Allergies as of 11/08/2023       Reactions   Narcan [naloxone] Other (See Comments)   Lethargic, sleepy, disoriented, altered mental status   Hydrocodone-acetaminophen Nausea Only   Penicillins Other (See Comments)        Medication List        Accurate as of November 08, 2023  7:58 AM. If you have any questions, ask your nurse or doctor.          amLODipine 10 MG tablet Commonly known as: NORVASC Take 1 tablet (10 mg total) by mouth daily.   aspirin EC 81 MG tablet Take 1 tablet (81 mg total) by mouth daily. Swallow whole.   atorvastatin 40 MG tablet Commonly known as: LIPITOR Take 1 tablet (40 mg total) by mouth daily.   cholecalciferol 25 MCG (1000 UNIT) tablet Commonly known as: VITAMIN D3 Take 1,000 Units by mouth daily.   empagliflozin 25 MG Tabs tablet Commonly known as: Jardiance Take 1 tablet (25 mg total) by mouth daily before breakfast.   metFORMIN 1000 MG tablet Commonly known as: GLUCOPHAGE Take 1 tablet (1,000 mg total) by mouth daily with breakfast.   metoprolol succinate 25 MG 24 hr tablet Commonly known as: TOPROL-XL Take 1 tablet (25 mg total) by mouth daily.   olmesartan 40 MG tablet Commonly known as: BENICAR Take 1 tablet (40 mg total) by mouth daily.   OneTouch Verio test strip Generic drug: glucose blood 1 each by Other route daily in the afternoon. Use as instructed   OneTouch Verio w/Device Kit 1 Device by Does not apply route daily in the afternoon.   tirzepatide 7.5  MG/0.5ML Pen Commonly known as: MOUNJARO Inject 7.5 mg into the skin once a week.         ALLERGIES: Allergies  Allergen Reactions   Narcan [Naloxone] Other (See Comments)    Lethargic, sleepy, disoriented, altered mental status   Hydrocodone-Acetaminophen Nausea Only   Penicillins Other (See Comments)     REVIEW OF SYSTEMS: A comprehensive ROS was conducted with the patient and is negative except as per HPI     OBJECTIVE:   VITAL SIGNS: BP 120/74 (BP Location: Left Arm, Patient Position: Sitting, Cuff Size: Large)   Pulse 76   Ht 5\' 10"  (1.778 m)   Wt 266 lb (120.7 kg)   SpO2 98%   BMI 38.17 kg/m    PHYSICAL EXAM:  General: Pt appears well and is in NAD  Lungs: Clear with good BS bilat   Heart: RRR   Abdomen: soft, nontender  Extremities: Trace  pretibial edema  Neuro: MS is good with appropriate affect, pt is alert and Ox3    DM foot exam: 08/09/2023  The skin of the feet is intact without sores or ulcerations. The pedal pulses are 2+ on right and 2+ on left. The sensation is intact to a screening 5.07, 10 gram monofilament bilaterally    DATA REVIEWED:  Lab Results  Component Value Date   HGBA1C 6.2 (A) 08/09/2023   HGBA1C 6.1 (A) 02/06/2023   HGBA1C 5.5 06/23/2022    Latest Reference Range & Units 08/09/23 07:55 10/15/23 08:32  ALDOSTERONE 0.0 - 30.0 ng/dL 6.9   ALDOS/RENIN RATIO 0.0 - 30.0  >41.3 (H)     Latest Reference Range & Units 08/09/23 07:55  Renin Activity, Plasma 0.167 - 5.380 ng/mL/hr <0.167 (L)   Dexamethasone Suppression test   Latest Reference Range & Units 10/15/23 08:32  Cortisol, Plasma mcg/dL 1.9 (L)      Latest Reference Range & Units 08/09/23 07:55  Sodium 135 - 145 mEq/L 142  Potassium 3.5 - 5.1 mEq/L 3.5  Chloride 96 - 112 mEq/L 103  CO2 19 - 32 mEq/L 31  Glucose 70 - 99 mg/dL 725 (H)  BUN 6 - 23 mg/dL 13  Creatinine 3.66 - 4.40 mg/dL 3.47  Calcium 8.4 - 42.5 mg/dL 9.4  GFR >95.63 mL/min 76.21    Latest  Reference Range & Units 08/09/23 07:55  TSH 0.35 - 5.50 uIU/mL 1.49  T4,Free(Direct) 0.60 - 1.60 ng/dL 8.75    Latest Reference Range & Units 08/09/23 07:55  Creatinine,U mg/dL 64.3  MICROALB/CREAT RATIO 0.0 - 30.0 mg/g 22.3      CT Abdomen 09/06/2023  Adrenals/Urinary Tract: Enhancing 11 mm left adrenal nodule on image 50/5 measures Hounsfield units of 9 pre contrast administration compatible with an adrenal adenoma. Enhancing 10 mm nodule in the inferior margin of the left adrenal gland on image 61/5 measures Hounsfield units of -2 pre contrast administration compatible with an adrenal adenoma.   ASSESSMENT / PLAN / RECOMMENDATIONS:   1) Type 2 Diabetes Mellitus, Optimally controlled, Without complications - Most recent A1c of 5.8 %. Goal A1c < 7.0 %.    -His A1c has been optimal  -He continues with weight loss -I have recommended discontinuing metformin as his A1c is too low   MEDICATIONS: Continue Mounjaro 7.5 mg weekly Discontinue metformin 1000 mg 1 tablet daily Continue Jardiance 25 mg daily  EDUCATION / INSTRUCTIONS: BG monitoring instructions: Patient is instructed to check his blood sugars 1 times a day, fasting . Call [facility] if: BG persistently < 70  I reviewed the Rule of 15 for the treatment of hypoglycemia in detail with the patient. Literature supplied.   2) Diabetic complications:  Eye: Does not have known diabetic retinopathy.  Neuro/ Feet: Does not have known diabetic peripheral neuropathy. Renal: Patient does not have known baseline CKD. He is  on an ACEI/ARB at present.     3) Dyslipidemia :  - LDL and Tg at goal  Medication Continue atorvastatin 40 mg daily   4)Left Adrenal Adenoma   -Patient with history of uncontrolled HTN, and hypokalemia -His renin was suppressed with elevated Aldo/renin while on olmesartan, no confirmation needed -Patient had 1 sample of saliva cortisol submitted (instead of 2) which was  abnormal, dexamethasone suppression test was slightly elevated at 1.9 -I will proceed with 24-hour urinary cortisol  and catecholamines -   F/U 6 months    Signed electronically by: Lyndle Herrlich, MD  [facility]  [facility] [address] [place], [place] [address] Phone: [phone] FAX: [phone]   CC: [name], NP [address] [place] [address] Phone: [phone]  Fax: [phone]    Return to Endocrinology clinic as below: Future Appointments  Date Time Provider Department Center  01/07/2024  8:30 AM [name], NP [facility]  02/12/2024  7:30 AM Loyce Flaming, Konrad Dolores, MD [facility] None

## 2023-11-13 ENCOUNTER — Telehealth: Payer: Self-pay | Admitting: Pharmacy Technician

## 2023-11-13 ENCOUNTER — Other Ambulatory Visit (HOSPITAL_COMMUNITY): Payer: Self-pay

## 2023-11-13 NOTE — Telephone Encounter (Signed)
Pharmacy Patient Advocate Encounter   Received notification from CoverMyMeds key: [id number] that prior authorization for [facility] 5mg  is due for renewal.   Insurance verification completed.   The patient is insured through [place] [place]   .  Dose has changed to 7.5mg  and per test claim too soon to fill. Last filled  11/02/23. Next fill date 01/04/24   Archived key

## 2023-11-19 ENCOUNTER — Other Ambulatory Visit: Payer: BC Managed Care – PPO

## 2023-11-19 DIAGNOSIS — D3502 Benign neoplasm of left adrenal gland: Secondary | ICD-10-CM | POA: Diagnosis not present

## 2023-11-23 LAB — CORTISOL, URINE, 24 HOUR
24 Hour urine volume (VMAHVA): 2250 mL
CREATININE, URINE: 2.68 g/(24.h) — ABNORMAL HIGH (ref 0.50–2.15)
Cortisol (Ur), Free: 47.6 ug/(24.h) (ref 4.0–50.0)

## 2023-11-25 LAB — CATECHOLAMINES, FRACTIONATED, URINE, 24 HOUR
Calc Total (E+NE): 84 ug/(24.h) (ref 26–121)
Creatinine, Urine mg/day-CATEUR: 2.76 g/(24.h) — ABNORMAL HIGH (ref 0.50–2.15)
Dopamine 24 Hr Urine: 342 ug/(24.h) (ref 52–480)
Epinephrine, 24H, Ur: 25 ug/(24.h) — ABNORMAL HIGH (ref 2–24)
Norepinephrine, 24H, Ur: 59 ug/(24.h) (ref 15–100)
Total Volume: 2250 mL

## 2023-11-26 ENCOUNTER — Encounter: Payer: Self-pay | Admitting: Internal Medicine

## 2024-01-07 ENCOUNTER — Ambulatory Visit: Payer: BC Managed Care – PPO | Admitting: Family

## 2024-01-07 ENCOUNTER — Encounter: Payer: Self-pay | Admitting: Family

## 2024-01-07 VITALS — BP 150/95 | HR 71 | Temp 97.9°F | Ht 70.0 in | Wt 264.0 lb

## 2024-01-07 DIAGNOSIS — E119 Type 2 diabetes mellitus without complications: Secondary | ICD-10-CM | POA: Diagnosis not present

## 2024-01-07 DIAGNOSIS — Z7985 Long-term (current) use of injectable non-insulin antidiabetic drugs: Secondary | ICD-10-CM | POA: Diagnosis not present

## 2024-01-07 DIAGNOSIS — I10 Essential (primary) hypertension: Secondary | ICD-10-CM

## 2024-01-07 DIAGNOSIS — Z6837 Body mass index (BMI) 37.0-37.9, adult: Secondary | ICD-10-CM

## 2024-01-07 MED ORDER — OLMESARTAN MEDOXOMIL 40 MG PO TABS: 40.0000 mg | ORAL_TABLET | Freq: Every day | ORAL | 1 refills | Status: DC

## 2024-01-07 MED ORDER — AMLODIPINE BESYLATE 10 MG PO TABS: 10.0000 mg | ORAL_TABLET | Freq: Every day | ORAL | 1 refills | Status: DC

## 2024-01-07 MED ORDER — METOPROLOL SUCCINATE ER 25 MG PO TB24: 25.0000 mg | ORAL_TABLET | Freq: Every day | ORAL | 1 refills | Status: DC

## 2024-01-07 NOTE — Assessment & Plan Note (Signed)
 Uncontrolled hypertension despite maximum medication. Possible link to stress and adrenal tumors. Mounjaro may aid in reducing blood pressure. Adrenal gland adenomas are too small to have any effect. F/U labs also negative. - Refill olmesartan 40mg  qam, amlodipine 10mg  qam, metoprolol 25mg  qpm. - Encourage stress management, exercise, gym or classes. Continue walking daily, get heart rate up in intervals. Continue listening to music to relax. Eat low sodium diet. - Advise home blood pressure monitoring, report if >145/90 mmHg. - F/U in 4 mos with labs if not done at ENDO.

## 2024-01-07 NOTE — Progress Notes (Signed)
 Patient ID: Miguel Craig, male    DOB: [birth year], 48 y.o.   MRN: [id number]  Chief Complaint  Patient presents with   Hypertension  Discussed the use of AI scribe software for clinical note transcription with the patient, who gave verbal consent to proceed.  History of Present Illness The patient, with a history of hypertension and diabetes, was referred to an endocrinologist due to persistently high blood pressure despite being on maximum doses of medication. The endocrinologist ordered a CT scan which revealed two small 11mm tumors on the left adrenal gland. The patient reports that the endocrinologist mentioned that these tumors could be causing the high blood pressure by releasing more hormones than normal. He then had several labs done including a 24h urine and cortisol testing which were normal, so no further workup needed at this point. He reports still having a lot of stress taking care of his family (elderly parents & aunts) and he is working 2 jobs, but states his work is going well. The patient admits to smoking as a coping mechanism for stress. The patient also has diabetes and is on [facility] & Jardiance for this, and his last A1C was wnl. He reports that he has been able to reduce his medication due to weight loss and improved blood sugar control. Assessment & Plan Hypertension - Uncontrolled hypertension despite maximum medication. Possible link to stress and adrenal tumors. Mounjaro may aid in reducing blood pressure. Adrenal gland adenomas are too small to have any effect. F/U labs also negative. - Refill olmesartan 40mg  qam, amlodipine 10mg  qam, metoprolol 25mg  qpm. - Encourage stress management, exercise, gym or classes. Continue walking daily, get heart rate up in intervals. Continue listening to music to relax. Eat low sodium diet. - Advise home blood pressure monitoring, report if >145/90 mmHg. - F/U in 4 mos with labs if not done at ENDO.  T2 Diabetes Mellitus  - Diabetes well-managed with Mounjaro 7.5mg  qweek, A1c improved, metformin discontinued. Continued weight loss crucial for control and medication coverage. - Continue Mounjaro for weight loss and glycemic control. - Encourage continued weight loss for health improvement - Continue to follow with ENDO  General Health Maintenance Stress from family responsibilities impacts health. Smoking cessation advised. Exercise and stress-relief activities recommended. - Encourage smoking cessation, suggest exercise or stress-relief activities. - Advise healthy diet and regular exercise.  Subjective:    Outpatient Medications Prior to Visit  Medication Sig Dispense Refill   amLODipine (NORVASC) 10 MG tablet Take 1 tablet (10 mg total) by mouth daily.     aspirin EC 81 MG tablet Take 1 tablet (81 mg total) by mouth daily. Swallow whole. 90 tablet 3   atorvastatin (LIPITOR) 40 MG tablet Take 1 tablet (40 mg total) by mouth daily. 90 tablet 3   Blood Glucose Monitoring Suppl (ONETOUCH VERIO) w/Device KIT 1 Device by Does not apply route daily in the afternoon. 1 kit 0   cholecalciferol (VITAMIN D3) 25 MCG (1000 UNIT) tablet Take 1,000 Units by mouth daily.     empagliflozin (JARDIANCE) 25 MG TABS tablet Take 1 tablet (25 mg total) by mouth daily before breakfast. 90 tablet 3   glucose blood (ONETOUCH VERIO) test strip 1 each by Other route daily in the afternoon. Use as instructed 100 each 3   metoprolol succinate (TOPROL-XL) 25 MG 24 hr tablet Take 1 tablet (25 mg total) by mouth daily.     olmesartan (BENICAR) 40 MG tablet Take 1 tablet (40 mg total) by mouth  daily.     tirzepatide (MOUNJARO) 7.5 MG/0.5ML Pen Inject 7.5 mg into the skin once a week. 6 mL 3   No facility-administered medications prior to visit.   Past Medical History:  Diagnosis Date   Acquired trigger finger of right index finger 10/25/2021   Asthma    Confusion 11/08/2016   Formatting of this note might be different from the  original. ---Jan 2016-Sleep Study---Obstructive Sleep Apnea  ---Jan 2018-MRI---No acute intracranial abnormality. Partially empty sella. Mild proptosis minimal paranasal sinus   Cyst on ear    left   Diabetes mellitus without complication (HCC)    Dizziness 10/19/2021   Dyslipidemia 10/19/2021   Elevated liver enzymes 01/05/2016   Formatting of this note might be different from the original. US  liver ordered 01/05/16 Findings consistent with nonalcoholic fatty liver disease, lifestyle interventions discussed with the patient 02/02/16 Hep screening negative   Epidermoid cyst of ear 08/26/2018   Hyperlipidemia    Hypertension    Hypokalemia 09/16/2016   Internal and external bleeding hemorrhoids 11/13/2018   Memory loss 11/08/2016   Formatting of this note might be different from the original. ---Jan 2016-Sleep Study---Obstructive Sleep Apnea  ---Jan 2018-MRI---No acute intracranial abnormality. Partially empty sella. Mild proptosis minimal paranasal sinus disease   Sleep apnea    uses CPAP nightly   Past Surgical History:  Procedure Laterality Date   ANKLE SURGERY Left    achilles tendon   Allergies  Allergen Reactions   Narcan [Naloxone] Other (See Comments)    Lethargic, sleepy, disoriented, altered mental status   Hydrocodone-Acetaminophen Nausea Only   Penicillins Other (See Comments)      Objective:    Physical Exam Vitals and nursing note reviewed.  Constitutional:      General: He is not in acute distress.    Appearance: Normal appearance.  HENT:     Head: Normocephalic.  Cardiovascular:     Rate and Rhythm: Normal rate and regular rhythm.  Pulmonary:     Effort: Pulmonary effort is normal.     Breath sounds: Normal breath sounds.  Musculoskeletal:        General: Normal range of motion.     Cervical back: Normal range of motion.  Skin:    General: Skin is warm and dry.  Neurological:     Mental Status: He is alert and oriented to person, place, and time.   Psychiatric:        Mood and Affect: Mood normal.   BP (!) 150/95 (BP Location: Left Arm, Patient Position: Sitting, Cuff Size: Large)   Pulse 71   Temp 97.9 F (36.6 C) (Temporal)   Ht 5\' 10"  (1.778 m)   Wt 264 lb (119.7 kg)   SpO2 100%   BMI 37.88 kg/m  Wt Readings from Last 3 Encounters:  01/07/24 264 lb (119.7 kg)  11/08/23 266 lb (120.7 kg)  09/06/23 271 lb (122.9 kg)      Versa Gore, NP

## 2024-02-02 ENCOUNTER — Other Ambulatory Visit: Payer: Self-pay | Admitting: Internal Medicine

## 2024-02-12 ENCOUNTER — Ambulatory Visit: Payer: BC Managed Care – PPO | Admitting: Internal Medicine

## 2024-02-12 ENCOUNTER — Encounter: Payer: Self-pay | Admitting: Internal Medicine

## 2024-02-12 VITALS — BP 136/88 | HR 70 | Ht 70.0 in | Wt 260.8 lb

## 2024-02-12 DIAGNOSIS — D3502 Benign neoplasm of left adrenal gland: Secondary | ICD-10-CM

## 2024-02-12 DIAGNOSIS — Z7984 Long term (current) use of oral hypoglycemic drugs: Secondary | ICD-10-CM | POA: Diagnosis not present

## 2024-02-12 DIAGNOSIS — E785 Hyperlipidemia, unspecified: Secondary | ICD-10-CM | POA: Diagnosis not present

## 2024-02-12 DIAGNOSIS — Z7985 Long-term (current) use of injectable non-insulin antidiabetic drugs: Secondary | ICD-10-CM | POA: Diagnosis not present

## 2024-02-12 DIAGNOSIS — E269 Hyperaldosteronism, unspecified: Secondary | ICD-10-CM

## 2024-02-12 DIAGNOSIS — E119 Type 2 diabetes mellitus without complications: Secondary | ICD-10-CM | POA: Diagnosis not present

## 2024-02-12 LAB — POCT GLYCOSYLATED HEMOGLOBIN (HGB A1C): Hemoglobin A1C: 5.5 % (ref 4.0–5.6)

## 2024-02-12 LAB — POCT GLUCOSE (DEVICE FOR HOME USE): Glucose Fasting, POC: 90 mg/dL (ref 70–99)

## 2024-02-12 NOTE — Patient Instructions (Signed)
-   Continue Mounjaro  7.5 mg weekly  - Continue Jardiance  25 mg, 1 tablet daily

## 2024-02-12 NOTE — Progress Notes (Signed)
 Name: Miguel Craig  MRN/ DOB: [id number], Dec 25, 1975   Age/ Sex: 48 y.o., male    PCP: Versa Gore, NP   Reason for Endocrinology Evaluation: Type 2 Diabetes Mellitus     Date of Initial Endocrinology Visit: 10/19/2021    PATIENT IDENTIFIER: Miguel Craig is a 48 y.o. male with a past medical history of T2DM, HTN, NASH, and OSA on CPAP . The patient presented for initial endocrinology clinic visit on 10/19/2021 for consultative assistance with his diabetes management.    HPI: Miguel Craig was    Diagnosed with DM yrs ago.             Hemoglobin A1c has ranged from 5.9% in 2022, peaking at % in 2023. Patient required assistance for hypoglycemia: yes - in 2017   Transitioned care from Dr. Marcella Serge 09/2021.   On his initial visit to our clinic his A1c was 6.0 % on Trulicity  and Synjary but had to switch Synjardy to Metforin and Jardiance  due to insurance changes    Switched Trulicity  to Mounjaro  07/2023  Discontinued Metformin  with an A1c 5.8% 10/2023   HTN HISTORY: Due to history of uncontrolled HTN and hypokalemia he was screened for Cushing syndrome and hyperaldosteronism.  Patient was noted with suppressed renin activity <0.167 , elevated Aldo/renin ratio >41.3, and an aldosterone level of 6.9.  Saliva cortisol was performed, but the patient was only able to submit 1 sample which was abnormal at 0.12 (reference 0.09) in 08/2023  Dexamethasone  suppression test was slightly elevated at 1.9 Mg/DL in 10/7251  24 hr urine cortisol was normal at 47.6 mcg in 10/2023  CT imaging of the abdomen 09/06/2023 revealed 2 left adrenal adenomas measuring up to 11 mm.  No further imaging indicated   SUBJECTIVE:   During the last visit (11/08/2023): A1c 5.8 %     Today (02/12/24): Miguel Craig  is here for a follow up on diabetes management. He has been checking glucose occasionally    Denies nausea or vomiting  Denies constipation or diarrhea  Denies chest pain or sob   Has  occasional  LE edema with prolonged walking Denies dizziness at this time Has not been checking BP at home   HOME DIABETES REGIMEN: Mounjaro  7.5 mg weekly  Jardiance  25 mg weekly     Statin: yes ACE-I/ARB: yes    METER DOWNLOAD SUMMARY: n/a   DIABETIC COMPLICATIONS: Microvascular complications:   Denies: CKD retinopathy, neuropathy  Last eye exam: 09/2021  Macrovascular complications:   Denies: CAD, PVD, CVA   PAST HISTORY: Past Medical History:  Past Medical History:  Diagnosis Date  . Acquired trigger finger of right index finger 10/25/2021  . Asthma   . Confusion 11/08/2016   Formatting of this note might be different from the original. ---Jan 2016-Sleep Study---Obstructive Sleep Apnea  ---Jan 2018-MRI---No acute intracranial abnormality. Partially empty sella. Mild proptosis minimal paranasal sinus  . Cyst on ear    left  . Diabetes mellitus without complication (HCC)   . Dizziness 10/19/2021  . Dyslipidemia 10/19/2021  . Elevated liver enzymes 01/05/2016   Formatting of this note might be different from the original. US  liver ordered 01/05/16 Findings consistent with nonalcoholic fatty liver disease, lifestyle interventions discussed with the patient 02/02/16 Hep screening negative  . Epidermoid cyst of ear 08/26/2018  . Hyperlipidemia   . Hypertension   . Hypokalemia 09/16/2016  . Internal and external bleeding hemorrhoids 11/13/2018  . Memory loss 11/08/2016   Formatting of this note might be  different from the original. ---Jan 2016-Sleep Study---Obstructive Sleep Apnea  ---Jan 2018-MRI---No acute intracranial abnormality. Partially empty sella. Mild proptosis minimal paranasal sinus disease  . Sleep apnea    uses CPAP nightly   Past Surgical History:  Past Surgical History:  Procedure Laterality Date  . ANKLE SURGERY Left    achilles tendon    Social History:  reports that he has been smoking cigarettes. He has a 16 pack-year smoking history. He  has never used smokeless tobacco. He reports that he does not currently use alcohol. He reports that he does not use drugs. Family History:  Family History  Problem Relation Age of Onset  . Hypertension Mother   . Diabetes Father   . Hyperlipidemia Father   . Colon cancer Neg Hx   . Esophageal cancer Neg Hx   . Pancreatic cancer Neg Hx   . Stomach cancer Neg Hx   . Liver disease Neg Hx   . Colon polyps Neg Hx   . Rectal cancer Neg Hx      HOME MEDICATIONS: Allergies as of 02/12/2024       Reactions   Narcan  [naloxone ] Other (See Comments)   Lethargic, sleepy, disoriented, altered mental status   Hydrocodone -acetaminophen  Nausea Only   Penicillins Other (See Comments)        Medication List        Accurate as of Feb 12, 2024  7:42 AM. If you have any questions, ask your nurse or doctor.          amLODipine  10 MG tablet Commonly known as: NORVASC  Take 1 tablet (10 mg total) by mouth daily.   aspirin  EC 81 MG tablet Take 1 tablet (81 mg total) by mouth daily. Swallow whole.   atorvastatin  40 MG tablet Commonly known as: LIPITOR Take 1 tablet (40 mg total) by mouth daily.   cholecalciferol 25 MCG (1000 UNIT) tablet Commonly known as: VITAMIN D3 Take 1,000 Units by mouth daily.   empagliflozin  25 MG Tabs tablet Commonly known as: Jardiance  Take 1 tablet (25 mg total) by mouth daily before breakfast.   metoprolol  succinate 25 MG 24 hr tablet Commonly known as: TOPROL -XL Take 1 tablet (25 mg total) by mouth at bedtime.   olmesartan  40 MG tablet Commonly known as: BENICAR  Take 1 tablet (40 mg total) by mouth daily.   OneTouch Verio test strip Generic drug: glucose blood 1 EACH BY OTHER ROUTE DAILY IN THE AFTERNOON. USE AS INSTRUCTED   glucose blood test strip SMARTSIG:1 Strip(s) Via Meter Daily   OneTouch Verio w/Device Kit 1 Device by Does not apply route daily in the afternoon.   tirzepatide  7.5 MG/0.5ML Pen Commonly known as: MOUNJARO  Inject 7.5  mg into the skin once a week.         ALLERGIES: Allergies  Allergen Reactions  . Narcan  [Naloxone ] Other (See Comments)    Lethargic, sleepy, disoriented, altered mental status  . Hydrocodone -Acetaminophen  Nausea Only  . Penicillins Other (See Comments)     REVIEW OF SYSTEMS: A comprehensive ROS was conducted with the patient and is negative except as per HPI     OBJECTIVE:   VITAL SIGNS: BP 136/88 (BP Location: Right Arm, Patient Position: Sitting, Cuff Size: Normal)   Pulse 70   Ht 5\' 10"  (1.778 m)   Wt 260 lb 12.8 oz (118.3 kg)   SpO2 98%   BMI 37.42 kg/m     Filed Weights   02/12/24 0729  Weight: 260 lb 12.8 oz (118.3 kg)  PHYSICAL EXAM:  General: Pt appears well and is in NAD  Lungs: Clear with good BS bilat   Heart: RRR   Abdomen: soft, nontender  Extremities: Trace  pretibial edema  Neuro: MS is good with appropriate affect, pt is alert and Ox3    DM foot exam: 08/09/2023  The skin of the feet is intact without sores or ulcerations. The pedal pulses are 2+ on right and 2+ on left. The sensation is intact to a screening 5.07, 10 gram monofilament bilaterally    DATA REVIEWED:  Lab Results  Component Value Date   HGBA1C 5.5 02/12/2024   HGBA1C 5.8 (A) 11/08/2023   HGBA1C 6.2 (A) 08/09/2023    Latest Reference Range & Units 02/12/24 08:04  Sodium 135 - 146 mmol/L 142  Potassium 3.5 - 5.3 mmol/L 3.3 (L)  Chloride 98 - 110 mmol/L 105  CO2 20 - 32 mmol/L 28  Glucose 65 - 99 mg/dL 88  BUN 7 - 25 mg/dL 12  Creatinine 1.61 - 0.96 mg/dL 0.45  Calcium  8.6 - 10.3 mg/dL 9.1  BUN/Creatinine Ratio 6 - 22 (calc) SEE NOTE:  eGFR > OR = 60 mL/min/1.67m2 87  Total CHOL/HDL Ratio <5.0 (calc) 3.3  Cholesterol <200 mg/dL 409  HDL Cholesterol > OR = 40 mg/dL 36 (L)  LDL Cholesterol (Calc) mg/dL (calc) 62  Non-HDL Cholesterol (Calc) <130 mg/dL (calc) 81  Triglycerides <150 mg/dL 811       Latest Reference Range & Units 08/09/23 07:55 10/15/23  08:32  ALDOSTERONE 0.0 - 30.0 ng/dL 6.9   ALDOS/RENIN RATIO 0.0 - 30.0  >41.3 (H)     Latest Reference Range & Units 08/09/23 07:55  Renin Activity, Plasma 0.167 - 5.380 ng/mL/hr <0.167 (L)   Dexamethasone  Suppression test   Latest Reference Range & Units 10/15/23 08:32  Cortisol, Plasma mcg/dL 1.9 (L)      Latest Reference Range & Units 08/09/23 07:55  Sodium 135 - 145 mEq/L 142  Potassium 3.5 - 5.1 mEq/L 3.5  Chloride 96 - 112 mEq/L 103  CO2 19 - 32 mEq/L 31  Glucose 70 - 99 mg/dL 914 (H)  BUN 6 - 23 mg/dL 13  Creatinine 7.82 - 9.56 mg/dL 2.13  Calcium  8.4 - 10.5 mg/dL 9.4  GFR >08.65 mL/min 76.21    Latest Reference Range & Units 08/09/23 07:55  TSH 0.35 - 5.50 uIU/mL 1.49  T4,Free(Direct) 0.60 - 1.60 ng/dL 7.84    Latest Reference Range & Units 08/09/23 07:55  Creatinine,U mg/dL 69.6  MICROALB/CREAT RATIO 0.0 - 30.0 mg/g 22.3      CT Abdomen 09/06/2023  Adrenals/Urinary Tract: Enhancing 11 mm left adrenal nodule on image 50/5 measures Hounsfield units of 9 pre contrast administration compatible with an adrenal adenoma. Enhancing 10 mm nodule in the inferior margin of the left adrenal gland on image 61/5 measures Hounsfield units of -2 pre contrast administration compatible with an adrenal adenoma.   ASSESSMENT / PLAN / RECOMMENDATIONS:   1) Type 2 Diabetes Mellitus, Optimally controlled, Without complications - Most recent A1c of 5.8 %. Goal A1c < 7.0 %.    -His A1c has been optimal  -He continues with weight loss -We discontinued Metformin  due to an A1c 5.8%    MEDICATIONS: Continue Mounjaro  7.5 mg weekly Continue Jardiance  25 mg daily  EDUCATION / INSTRUCTIONS: BG monitoring instructions: Patient is instructed to check his blood sugars 1 times a day, fasting . Call [facility] if: BG persistently < 70  I reviewed the Rule  of 15 for the treatment of hypoglycemia in detail with the patient. Literature supplied.   2) Diabetic  complications:  Eye: Does not have known diabetic retinopathy.  Neuro/ Feet: Does not have known diabetic peripheral neuropathy. Renal: Patient does not have known baseline CKD. He is  on an ACEI/ARB at present.     3) Dyslipidemia :  - Lipid panel acceptable  Medication Continue atorvastatin  40 mg daily   4)Left Adrenal Adenoma   -Patient with history of uncontrolled HTN, and hypokalemia -His renin was suppressed with elevated Aldo/renin while on olmesartan , no confirmation needed -Patient had 1 sample of saliva cortisol submitted (instead of 2) which was abnormal, dexamethasone  suppression test was slightly elevated at 1.9 - 24-hour urinary cortisol was normal and catecholamines showed slight elevated of epinephrine at 25 mcg with elevated creatinine - Will proceed with AVS , a referral has been placed to interventional radiology    5) Hypokalemia:  - Will replenish  Medication KCl 20 mill equivalent daily  F/U 6 months    Signed electronically by: Natale Bail, MD  [facility]  [facility] [address] [place] [place], [place] [address] Phone: [phone] FAX: [phone]   CC: Versa Gore, NP [address] [place] [address] Phone: [phone]  Fax: [phone]    Return to Endocrinology clinic as below: Future Appointments  Date Time Provider Department Center  05/07/2024  8:30 AM Parrett, Macdonald Savoy, NP [facility] None  05/08/2024  8:00 AM Versa Gore, NP [facility]

## 2024-02-13 ENCOUNTER — Ambulatory Visit: Payer: Self-pay | Admitting: Internal Medicine

## 2024-02-13 MED ORDER — POTASSIUM CHLORIDE CRYS ER 20 MEQ PO TBCR: 20.0000 meq | EXTENDED_RELEASE_TABLET | Freq: Every day | ORAL | 4 refills | Status: DC

## 2024-02-14 ENCOUNTER — Other Ambulatory Visit: Payer: Self-pay | Admitting: Internal Medicine

## 2024-02-14 DIAGNOSIS — E1169 Type 2 diabetes mellitus with other specified complication: Secondary | ICD-10-CM

## 2024-02-14 LAB — LIPID PANEL
Cholesterol: 117 mg/dL (ref <200)
HDL: 36 mg/dL — ABNORMAL LOW (ref >=40)
LDL Cholesterol (Calc): 62 mg/dL
Non-HDL Cholesterol (Calc): 81 mg/dL (ref <130)
Total CHOL/HDL Ratio: 3.3 (calc) (ref <5.0)
Triglycerides: 103 mg/dL (ref <150)

## 2024-02-14 LAB — BASIC METABOLIC PANEL WITH GFR
BUN: 12 mg/dL (ref 7–25)
CO2: 28 mmol/L (ref 20–32)
Calcium: 9.1 mg/dL (ref 8.6–10.3)
Chloride: 105 mmol/L (ref 98–110)
Creat: 1.06 mg/dL (ref 0.60–1.29)
Glucose, Bld: 88 mg/dL (ref 65–99)
Potassium: 3.3 mmol/L — ABNORMAL LOW (ref 3.5–5.3)
Sodium: 142 mmol/L (ref 135–146)
eGFR: 87 mL/min/{1.73_m2} (ref >=60)

## 2024-02-14 LAB — ACTH: C206 ACTH: 12 pg/mL (ref 6–50)

## 2024-02-14 LAB — CORTISOL: Cortisol, Plasma: 7 ug/dL

## 2024-04-04 DIAGNOSIS — D3502 Benign neoplasm of left adrenal gland: Secondary | ICD-10-CM | POA: Diagnosis not present

## 2024-04-09 ENCOUNTER — Encounter: Payer: Self-pay | Admitting: Internal Medicine

## 2024-05-07 ENCOUNTER — Ambulatory Visit: Admitting: Adult Health

## 2024-05-08 ENCOUNTER — Ambulatory Visit: Admitting: Family

## 2024-05-08 DIAGNOSIS — E269 Hyperaldosteronism, unspecified: Secondary | ICD-10-CM | POA: Diagnosis not present

## 2024-05-08 DIAGNOSIS — D3502 Benign neoplasm of left adrenal gland: Secondary | ICD-10-CM | POA: Diagnosis not present

## 2024-05-08 NOTE — Procedures (Signed)
 [facility] Brief Op Note  Pre-procedure Diagnosis: Hyperaldosteronism  Procedure: Adrenal vein sampling, bilateral  Medications:  ondansetron  (PF) (ZOFRAN ) injection - 4 mg midazolam (VERSED) 1 mg/mL injection - 4 mg fentaNYL (PF) (SUBLIMAZE) injection - 100 mcg heparin injection - 5,000 Units (Totals for administrations occurring from 0826 to 0925 on 05/08/24)  Complications: None immediate.  EBL: minimal  Specimens removed: No  Findings: 1. Sampling of two candidate right sided adrenal veins. Samples sent for biochemical analysis. 2. Sampling of candidate left adrenal vein. Sample sent for biochemical analysis. 3. Normal inferior cavogram.  Post-procedure Diagnosis: Same  Plan: 1. Patient to IRU with right groin sheath in place. Pending cortisol levels, the sheath will be removed in IRU versus return to procedure room for repeat sampling.  Interventional Fellow: Curlie; [id number]  Interventional Attending: Yvonna Rosella, MD

## 2024-05-08 NOTE — Progress Notes (Addendum)
 S/p AVS. R venous sheath CDI. VSS. Denies pain, N/V. Sheath removed by Dr Norvell. Site CDI. VSS. Discharge instructions reviewed, all questions answered.

## 2024-05-08 NOTE — H&P (Signed)
 [facility] Pre-procedure H&P  Demographics: Mr. Chio is a 48 y.o. male  Procedure requested: adrenal vein sampling Indication: adrenal adenoma  No Known Allergies  Patient allergic to iodinated contrast: No  No current outpatient medications on file.   Current Facility-Administered Medications  Medication Dose Route Frequency Provider Last Rate Last Admin  . cosyntropin (CORTROSYN) injection 250 mcg  250 mcg Intravenous Once Prescott, Jessica Reavis, PA       No past medical history on file. There is no problem list on file for this patient.  Review of Systems is positive for: None  No data found. There were no vitals filed for this visit.  PHYSICAL EXAM:  General: No acute distress Mental Status Examination: Alert Airway: II (hard and soft palate, upper portion of tonsils and uvula visible) Respiratory Examination: Non-labored breathing Cardiovascular: Acyanotic Neurologic: No gross deficit   No results for input(s): CBC, INR, PLT, CREATININE in the last 168 hours.  Prior Imaging: Relevant imaging reviewed.  Procedure: -- Adrenal vein sampling. Site has not been marked because it's not applicable to this case. This will be completed today. This procedure has been fully reviewed with the patient, and written informed consent has been obtained. --Sedation: ASA Grade Assessment: 2 - A patient with mild systemic disease. Moderate sedation is appropriate for this patient at this time.  --Implants to be placed: Not applicable  Pre-Procedure Planning:  --Antibiotics: The patient will require no antibiotics to be administered in VIR immediately prior to the procedure.  --Anticoagulation: N/A --NPO: Yes - maintain NPO status --Blood products: Transfusion not anticipated. --DNR: No.   Post-procedure care: Outpatient to be discharged from IRU.  Plan above was discussed with VIR attending Yvonna Rosella MD    Hezzie Bellingham, MD, PhD [facility],  PGY-3

## 2024-05-09 ENCOUNTER — Ambulatory Visit: Admitting: Family

## 2024-05-09 ENCOUNTER — Encounter: Payer: Self-pay | Admitting: Family

## 2024-05-09 VITALS — BP 122/82 | HR 89 | Temp 98.4°F | Ht 70.0 in | Wt 255.1 lb

## 2024-05-09 DIAGNOSIS — E119 Type 2 diabetes mellitus without complications: Secondary | ICD-10-CM | POA: Diagnosis not present

## 2024-05-09 DIAGNOSIS — Z7985 Long-term (current) use of injectable non-insulin antidiabetic drugs: Secondary | ICD-10-CM | POA: Diagnosis not present

## 2024-05-09 DIAGNOSIS — Z7984 Long term (current) use of oral hypoglycemic drugs: Secondary | ICD-10-CM

## 2024-05-09 DIAGNOSIS — I10 Essential (primary) hypertension: Secondary | ICD-10-CM

## 2024-05-09 DIAGNOSIS — D3502 Benign neoplasm of left adrenal gland: Secondary | ICD-10-CM

## 2024-05-09 MED ORDER — AMLODIPINE BESYLATE 10 MG PO TABS: 10.0000 mg | ORAL_TABLET | Freq: Every day | ORAL | 1 refills | Status: DC

## 2024-05-09 MED ORDER — OLMESARTAN MEDOXOMIL 40 MG PO TABS: 40.0000 mg | ORAL_TABLET | Freq: Every day | ORAL | 1 refills | Status: DC

## 2024-05-09 MED ORDER — METOPROLOL SUCCINATE ER 25 MG PO TB24: 25.0000 mg | ORAL_TABLET | Freq: Every day | ORAL | 1 refills | Status: DC

## 2024-05-09 NOTE — Assessment & Plan Note (Signed)
 Type 2 diabetes well-managed with Mounjaro  and Jardiance . A1c at 5.5. Mounjaro  aiding weight loss and glycemic control. Metformin  discontinued. - Continue Mounjaro  and Jardiance  as prescribed. - Consider future Mounjaro  dosage adjustment based on weight and blood sugar control. - Continue to follow with ENDO

## 2024-05-09 NOTE — Progress Notes (Signed)
 Patient ID: Miguel Craig, male    DOB: 27-Feb-1976, 48 y.o.   MRN: [id number]  Chief Complaint  Patient presents with   Diabetes   Hypertension  Discussed the use of AI scribe software for clinical note transcription with the patient, who gave verbal consent to proceed.  History of Present Illness Miguel Craig is a 48 year old male with an adrenal gland tumor who presents for follow-up for HTN.  Adrenal gland mass - Left adrenal gland tumor with recent second venous sampling performed, yielding adequate tissue samples - Awaiting final pathology results from the biopsy - Preliminary results indicate elevated cortisol levels in the right adrenal vein - Post-procedure soreness present, otherwise stable  Hypertension - Managed with amlodipine , Olmesartan  and metoprolol  - Blood pressure readings remain within normal range  Diabetes mellitus - Managed with Mounjaro  and Jardiance  - Last A1c is 5.5 - Metformin  discontinued by ENDO  Hyperlipidemia - Takes Lipitor for cholesterol management  Electrolyte disturbance - Takes potassium supplements for previously low potassium levels  Morning headaches - Experiences headaches upon waking - Headaches occur despite normal blood pressure readings  Assessment & Plan Adrenal adenoma, left adrenal gland Left adrenal adenoma with elevated cortisol levels. High cortisol in right adrenal vein despite left-sided adenoma. Possible pressure effect causing symptoms. - Contact Endo provider if no message on lab results by next week. - Advised avoiding strenuous activities and heavy lifting post-procedure for up to a week.  Type 2 diabetes mellitus Type 2 diabetes well-managed with Mounjaro  and Jardiance . A1c at 5.5. Mounjaro  aiding weight loss and glycemic control. Metformin  discontinued. - Continue Mounjaro  and Jardiance  as prescribed. - Consider future Mounjaro  dosage adjustment based on weight and blood sugar control. - Continue to follow  with ENDO  Hypertension Hypertension controlled with amlodipine  10mg  qhs, metoprolol  25mg  qhs, Olmesartan  40mg  qam. Potassium supplementation due to low levels, possibly medication-related. - Continue amlodipine , olmesartan  and metoprolol  as prescribed, sending refills. - F/U in 4 months or prn   Subjective:    Outpatient Medications Prior to Visit  Medication Sig Dispense Refill   amLODipine  (NORVASC ) 10 MG tablet Take 1 tablet (10 mg total) by mouth daily. 90 tablet 1   aspirin  EC 81 MG tablet Take 1 tablet (81 mg total) by mouth daily. Swallow whole. 90 tablet 3   atorvastatin  (LIPITOR) 40 MG tablet TAKE 1 TABLET BY MOUTH EVERY DAY 90 tablet 3   cholecalciferol (VITAMIN D3) 25 MCG (1000 UNIT) tablet Take 1,000 Units by mouth daily.     empagliflozin  (JARDIANCE ) 25 MG TABS tablet Take 1 tablet (25 mg total) by mouth daily before breakfast. 90 tablet 3   metoprolol  succinate (TOPROL -XL) 25 MG 24 hr tablet Take 1 tablet (25 mg total) by mouth at bedtime. 90 tablet 1   olmesartan  (BENICAR ) 40 MG tablet Take 1 tablet (40 mg total) by mouth daily. 90 tablet 1   potassium chloride  SA (KLOR-CON  M) 20 MEQ tablet Take 1 tablet (20 mEq total) by mouth daily. 30 tablet 4   tirzepatide  (MOUNJARO ) 7.5 MG/0.5ML Pen Inject 7.5 mg into the skin once a week. 6 mL 3   Blood Glucose Monitoring Suppl (ONETOUCH VERIO) w/Device KIT 1 Device by Does not apply route daily in the afternoon. 1 kit 0   glucose blood test strip SMARTSIG:1 Strip(s) Via Meter Daily     ONETOUCH VERIO test strip 1 EACH BY OTHER ROUTE DAILY IN THE AFTERNOON. USE AS INSTRUCTED 100 strip 3   No facility-administered medications prior  to visit.   Past Medical History:  Diagnosis Date   Acquired trigger finger of right index finger 10/25/2021   Asthma    Confusion 11/08/2016   Formatting of this note might be different from the original. ---Jan 2016-Sleep Study---Obstructive Sleep Apnea  ---Jan 2018-MRI---No acute intracranial  abnormality. Partially empty sella. Mild proptosis minimal paranasal sinus   Cyst on ear    left   Diabetes mellitus without complication (HCC)    Dizziness 10/19/2021   Dyslipidemia 10/19/2021   Elevated liver enzymes 01/05/2016   Formatting of this note might be different from the original. US  liver ordered 01/05/16 Findings consistent with nonalcoholic fatty liver disease, lifestyle interventions discussed with the patient 02/02/16 Hep screening negative   Epidermoid cyst of ear 08/26/2018   Hyperlipidemia    Hypertension    Hypokalemia 09/16/2016   Internal and external bleeding hemorrhoids 11/13/2018   Memory loss 11/08/2016   Formatting of this note might be different from the original. ---Jan 2016-Sleep Study---Obstructive Sleep Apnea  ---Jan 2018-MRI---No acute intracranial abnormality. Partially empty sella. Mild proptosis minimal paranasal sinus disease   Sleep apnea    uses CPAP nightly   Past Surgical History:  Procedure Laterality Date   ANKLE SURGERY Left    achilles tendon   Allergies  Allergen Reactions   Narcan  [Naloxone ] Other (See Comments)    Lethargic, sleepy, disoriented, altered mental status   Hydrocodone -Acetaminophen  Nausea Only   Penicillins Other (See Comments)      Objective:    Physical Exam Vitals and nursing note reviewed.  Constitutional:      General: He is not in acute distress.    Appearance: Normal appearance. He is obese.  HENT:     Head: Normocephalic.  Cardiovascular:     Rate and Rhythm: Normal rate and regular rhythm.  Pulmonary:     Effort: Pulmonary effort is normal.     Breath sounds: Normal breath sounds.  Musculoskeletal:        General: Normal range of motion.     Cervical back: Normal range of motion.  Skin:    General: Skin is warm and dry.  Neurological:     Mental Status: He is alert and oriented to person, place, and time.  Psychiatric:        Mood and Affect: Mood normal.    BP 122/82 (BP Location: Left Arm,  Patient Position: Sitting, Cuff Size: Large)   Pulse 89   Temp 98.4 F (36.9 C) (Temporal)   Ht 5' 10 (1.778 m)   Wt 255 lb 2 oz (115.7 kg)   SpO2 97%   BMI 36.61 kg/m  Wt Readings from Last 3 Encounters:  05/09/24 255 lb 2 oz (115.7 kg)  02/12/24 260 lb 12.8 oz (118.3 kg)  01/07/24 264 lb (119.7 kg)      Lucius Krabbe, NP

## 2024-05-09 NOTE — Assessment & Plan Note (Signed)
 Left adrenal adenoma with elevated cortisol levels. High cortisol in right adrenal vein despite left-sided adenoma. Possible pressure effect causing symptoms. - Contact Endo provider if no message on lab results by next week. - Advised avoiding strenuous activities and heavy lifting post-procedure for up to a week.

## 2024-05-09 NOTE — Assessment & Plan Note (Signed)
 Hypertension controlled with amlodipine  10mg  qhs, metoprolol  25mg  qhs, Olmesartan  40mg  qam. BP very good today. Potassium supplementation due to low levels, possibly medication-related. - Continue amlodipine , olmesartan  and metoprolol  as prescribed, sending refills. - F/U in 4 months or prn

## 2024-05-10 ENCOUNTER — Other Ambulatory Visit: Payer: Self-pay | Admitting: Internal Medicine

## 2024-05-13 ENCOUNTER — Encounter: Payer: Self-pay | Admitting: Family

## 2024-05-13 ENCOUNTER — Ambulatory Visit: Payer: Self-pay | Admitting: Family

## 2024-05-13 DIAGNOSIS — E11319 Type 2 diabetes mellitus with unspecified diabetic retinopathy without macular edema: Secondary | ICD-10-CM | POA: Insufficient documentation

## 2024-05-13 NOTE — Progress Notes (Signed)
 diabetic eye exam done 05/12/2024

## 2024-05-22 ENCOUNTER — Encounter: Payer: Self-pay | Admitting: Internal Medicine

## 2024-06-03 ENCOUNTER — Telehealth: Payer: Self-pay | Admitting: Internal Medicine

## 2024-06-03 DIAGNOSIS — D3502 Benign neoplasm of left adrenal gland: Secondary | ICD-10-CM

## 2024-06-03 NOTE — Telephone Encounter (Signed)
 I spoken to [name] on 06/03/2022 at 1330    We discussed AVS results      Cortisol Aldosterone  LAV 181.9 971  RAV 757.1 1120  IVC 11.8 17   Selectivity index:    Left  = 15  Right= 64                              Which confirms proper placement of the catheter into the adrenal veins   Corrected aldosterone levels:  Left= 5.33 Right= 1.48    Lateralization index:  3.6   It appears that he has lateralization towards the left adrenal gland, which is expected with 2 nodules on the left adrenal gland.   I did offer evaluation by surgery versus continued medical management and continuing to monitor his levels    Patient interested in surgical intervention if deemed appropriate by the surgeon, referral will be placed

## 2024-06-05 ENCOUNTER — Ambulatory Visit: Admitting: Adult Health

## 2024-06-10 DIAGNOSIS — Z23 Encounter for immunization: Secondary | ICD-10-CM | POA: Diagnosis not present

## 2024-06-10 DIAGNOSIS — Q8789 Other specified congenital malformation syndromes, not elsewhere classified: Secondary | ICD-10-CM | POA: Diagnosis not present

## 2024-06-10 DIAGNOSIS — F17210 Nicotine dependence, cigarettes, uncomplicated: Secondary | ICD-10-CM | POA: Diagnosis not present

## 2024-06-10 DIAGNOSIS — E876 Hypokalemia: Secondary | ICD-10-CM | POA: Diagnosis not present

## 2024-06-10 DIAGNOSIS — E279 Disorder of adrenal gland, unspecified: Secondary | ICD-10-CM | POA: Diagnosis not present

## 2024-06-10 DIAGNOSIS — E2609 Other primary hyperaldosteronism: Secondary | ICD-10-CM | POA: Diagnosis not present

## 2024-06-10 DIAGNOSIS — R569 Unspecified convulsions: Secondary | ICD-10-CM | POA: Diagnosis not present

## 2024-06-10 DIAGNOSIS — D3502 Benign neoplasm of left adrenal gland: Secondary | ICD-10-CM | POA: Diagnosis not present

## 2024-06-10 DIAGNOSIS — E249 Cushing's syndrome, unspecified: Secondary | ICD-10-CM | POA: Diagnosis not present

## 2024-06-11 DIAGNOSIS — D3502 Benign neoplasm of left adrenal gland: Secondary | ICD-10-CM

## 2024-07-03 ENCOUNTER — Other Ambulatory Visit: Payer: Self-pay | Admitting: Internal Medicine

## 2024-07-23 ENCOUNTER — Encounter: Payer: Self-pay | Admitting: Adult Health

## 2024-07-23 ENCOUNTER — Ambulatory Visit: Admitting: Adult Health

## 2024-07-23 VITALS — BP 124/82 | HR 84 | Temp 98.5°F | Ht 69.0 in | Wt 258.8 lb

## 2024-07-23 DIAGNOSIS — G4733 Obstructive sleep apnea (adult) (pediatric): Secondary | ICD-10-CM

## 2024-07-23 DIAGNOSIS — Z01811 Encounter for preprocedural respiratory examination: Secondary | ICD-10-CM | POA: Diagnosis not present

## 2024-07-23 DIAGNOSIS — Z6838 Body mass index (BMI) 38.0-38.9, adult: Secondary | ICD-10-CM

## 2024-07-23 DIAGNOSIS — E119 Type 2 diabetes mellitus without complications: Secondary | ICD-10-CM

## 2024-07-23 DIAGNOSIS — F17210 Nicotine dependence, cigarettes, uncomplicated: Secondary | ICD-10-CM

## 2024-07-23 DIAGNOSIS — I10 Essential (primary) hypertension: Secondary | ICD-10-CM | POA: Diagnosis not present

## 2024-07-23 DIAGNOSIS — E669 Obesity, unspecified: Secondary | ICD-10-CM

## 2024-07-23 NOTE — Patient Instructions (Addendum)
 Wear CPAP all night long for at least 6hr or more.  Continue to work on weight loss Do not drive if sleepy.  Work on not smoking Good luck with upcoming surgery  Follow-up with Dr. Jude or Enrica Corliss NP  in 1 year and As needed

## 2024-07-23 NOTE — Progress Notes (Signed)
 @Patient  ID: Miguel Craig, male    DOB: September 30, 1975, 48 y.o.   MRN: [id number]  Chief Complaint  Patient presents with   Obstructive Sleep Apnea    Referring provider: Lucius Krabbe, NP  HPI: 48 year old male followed for obstructive sleep apnea Medical history significant for DM and HTN     TEST/EVENTS : Reviewed 07/23/2024  Sleep study 09/2016 AHI -21.5/hr  Optimal control at 17cmH20    Discussed the use of AI scribe software for clinical note transcription with the patient, who gave verbal consent to proceed.  History of Present Illness Miguel Craig is a 48 year old male with sleep apnea and diabetes who presents for a routine follow-up for sleep apnea. .  He has been using a CPAP machine with a full face mask for his sleep apnea for a couple of years without issues and feels rested.  Patient is currently wearing his CPAP most every night.  Gets in about 5 hours of usage.  CPAP download shows excellent compliance with daily average usage at 5 hours.  Patient is on CPAP 18 cm H2O.  AHI 0.2/hour patient uses [facility] for DME   He is currently on Mounjaro  (tirzepatide ) for diabetes management, which was switched from a previous medication due to availability issues. His dose was recently increased from 5 mg to 7.5 mg.  He is actively working on weight loss.  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  He is found to have an adrenal adenoma and is scheduled for upcoming surgery.  He is scheduled for surgery on Dec 12th to remove his left adrenal gland due to adenoma found on a CT scan.  Epic care everywhere notes were reviewed.    .     Allergies  Allergen Reactions   Narcan  [Naloxone ] Other (See Comments)    Lethargic, sleepy, disoriented, altered mental status   Hydrocodone -Acetaminophen  Nausea Only   Penicillins Other (See Comments)     Immunization History  Administered Date(s) Administered   Influenza Inj Mdck Quad Pf 07/14/2016   Influenza Split 06/25/2014   Influenza, Seasonal, Injecte, Preservative Fre 06/30/2015, 08/09/2023   Influenza,inj,Quad PF,6+ Mos 06/24/2021   Influenza,inj,quad, With Preservative 07/14/2016, 08/15/2018, 08/28/2019   Influenza-Unspecified 06/25/2014, 06/30/2015, 07/14/2016, 07/09/2022   Janssen (J&J) SARS-COV-2 Vaccination 01/03/2020, 08/30/2020   Pneumococcal Polysaccharide-23 10/09/2014   Pneumococcal-Unspecified 07/09/2022   Tdap 02/08/2015    Past Medical History:  Diagnosis Date   Acquired trigger finger of right index finger 10/25/2021   Asthma    Confusion 11/08/2016   Formatting of this note might be different from the original. ---Jan 2016-Sleep Study---Obstructive Sleep Apnea  ---Jan 2018-MRI---No acute intracranial abnormality. Partially empty sella. Mild proptosis minimal paranasal sinus   Cyst on ear    left   Diabetes mellitus without complication (HCC)    Dizziness 10/19/2021   Dyslipidemia 10/19/2021   Elevated liver enzymes 01/05/2016   Formatting of this note might be different from the original. US  liver ordered 01/05/16 Findings consistent with nonalcoholic fatty liver disease, lifestyle interventions discussed with the patient 02/02/16 Hep screening negative   Epidermoid cyst of ear 08/26/2018   Hyperlipidemia    Hypertension    Hypokalemia 09/16/2016   Internal and external bleeding hemorrhoids 11/13/2018   Memory loss 11/08/2016   Formatting of this note might be different from the original. ---Jan 2016-Sleep Study---Obstructive Sleep Apnea  ---Jan 2018-MRI---No acute intracranial abnormality. Partially empty sella.  Mild proptosis minimal paranasal sinus disease   Sleep apnea    uses CPAP nightly    Tobacco History: Social History   Tobacco Use  Smoking Status Some Days   Current packs/day: 1.00   Average packs/day: 1 pack/day for 16.0 years (16.0  ttl pk-yrs)   Types: Cigarettes  Smokeless Tobacco Never  Tobacco Comments   Smoking 4-5 cigarettes/day, some days does not smoke at all. 07/23/24   Ready to quit: Not Answered Counseling given: Not Answered Tobacco comments: Smoking 4-5 cigarettes/day, some days does not smoke at all. 07/23/24   Outpatient Medications Prior to Visit  Medication Sig Dispense Refill   amLODipine  (NORVASC ) 10 MG tablet Take 1 tablet (10 mg total) by mouth daily. 90 tablet 1   aspirin  EC 81 MG tablet Take 1 tablet (81 mg total) by mouth daily. Swallow whole. 90 tablet 3   atorvastatin  (LIPITOR) 40 MG tablet TAKE 1 TABLET BY MOUTH EVERY DAY 90 tablet 3   Blood Glucose Monitoring Suppl (ONETOUCH VERIO) w/Device KIT 1 Device by Does not apply route daily in the afternoon. 1 kit 0   cholecalciferol (VITAMIN D3) 25 MCG (1000 UNIT) tablet Take 1,000 Units by mouth daily.     empagliflozin  (JARDIANCE ) 25 MG TABS tablet Take 1 tablet (25 mg total) by mouth daily before breakfast. 90 tablet 3   glucose blood test strip SMARTSIG:1 Strip(s) Via Meter Daily     metoprolol  succinate (TOPROL -XL) 25 MG 24 hr tablet Take 1 tablet (25 mg total) by mouth at bedtime. 90 tablet 1   olmesartan  (BENICAR ) 40 MG tablet Take 1 tablet (40 mg total) by mouth daily. 90 tablet 1   ONETOUCH VERIO test strip 1 EACH BY OTHER ROUTE DAILY IN THE AFTERNOON. USE AS INSTRUCTED 100 strip 3   potassium chloride  SA (KLOR-CON  M) 20 MEQ tablet TAKE 1 TABLET BY MOUTH EVERY DAY 90 tablet 1   tirzepatide  (MOUNJARO ) 7.5 MG/0.5ML Pen INJECT 7.5 MG SUBCUTANEOUSLY WEEKLY 6 mL 1   No facility-administered medications prior to visit.     Review of Systems:   Constitutional:   No  weight loss, night sweats,  Fevers, chills, fatigue, or  lassitude.  HEENT:   No headaches,  Difficulty swallowing,  Tooth/dental problems, or  Sore throat,                No sneezing, itching, ear ache, nasal congestion, post nasal drip,   CV:  No chest pain,  Orthopnea,  PND, swelling in lower extremities, anasarca, dizziness, palpitations, syncope.   GI  No heartburn, indigestion, abdominal pain, nausea, vomiting, diarrhea, change in bowel habits, loss of appetite, bloody stools.   Resp: No shortness of breath with exertion or at rest.  No excess mucus, no productive cough,  No non-productive cough,  No coughing up of blood.  No change in color of mucus.  No wheezing.  No chest wall deformity  Skin: no rash or lesions.  GU: no dysuria, change in color of urine, no urgency or frequency.  No flank pain, no hematuria   MS:  No joint pain or swelling.  No decreased range of motion.  No back pain.    Physical Exam  BP 124/82   Pulse 84   Temp 98.5 F (36.9 C)   Ht 5' 9 (1.753 m) Comment: Per pt  Wt 258 lb 12.8 oz (117.4 kg)   SpO2 97% Comment: RA  BMI 38.22 kg/m   GEN: A/Ox3; pleasant , NAD, well nourished  HEENT:  [place]/AT,  NOSE-clear, THROAT-clear, no lesions, no postnasal drip or exudate noted. Class 3 MP airway   NECK:  Supple w/ fair ROM; no JVD; normal carotid impulses w/o bruits; no thyromegaly or nodules palpated; no lymphadenopathy.    RESP  Clear  P & A; w/o, wheezes/ rales/ or rhonchi. no accessory muscle use, no dullness to percussion  CARD:  RRR, no m/r/g, no peripheral edema, pulses intact, no cyanosis or clubbing.  GI:   Soft & nt; nml bowel sounds; no organomegaly or masses detected.   Musco: Warm bil, no deformities or joint swelling noted.   Neuro: alert, no focal deficits noted.    Skin: Warm, no lesions or rashes    Lab Results:Reviewed 07/23/2024     BNP No results found for: BNP  ProBNP No results found for: PROBNP  Imaging: No results found.  Administration History     None           No data to display          No results found for: NITRICOXIDE      No data to display              Assessment & Plan:   Assessment and Plan Assessment & Plan Obstructive sleep apnea   -controlled Obstructive sleep apnea is well-controlled with CPAP therapy. He uses a full face mask without issues and feels rested and active. Continue CPAP therapy nightly for at least six hours, use distilled water in the CPAP chamber, clean the mask daily, and rinse CPAP tubing weekly.   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  He is fully independent.  Sleep apnea is well-controlled on CPAP.  Would use CPAP in the postop setting as indicated.  Continue CPAP at bedtime during hospital stay..  Essential hypertension  -recent difficulty with fluctuating blood pressures.  Continue with ongoing workup for primary hyperaldosteronism and hypercortisolism  Type 2 diabetes mellitus   Type 2 diabetes mellitus is managed with Mounjaro  (tirzepatide ), which also aids in weight loss.   Obesity  -current BMI 38.  Patient is encouraged on healthy weight loss.. Weight loss is expected to improve sleep apnea and blood pressure and diabetic management. Aim for weight reduction to 200 lbs, implement lifestyle changes including healthier food choices, smaller portions, and regular exercise, and engage in 30 minutes of exercise daily.  Tobacco use   He reports occasional smoking and acknowledges the need to quit. Encourage smoking cessation.   Plan  Patient Instructions  Wear CPAP all night long for at least 6hr or more.  Continue to work on weight loss Do not drive if sleepy.  Work on not smoking Good luck with upcoming surgery  Follow-up with Dr. Jude or Kanin Lia NP  in 1 year and As needed         Madelin Stank, NP 07/23/2024  .

## 2024-07-25 DIAGNOSIS — E279 Disorder of adrenal gland, unspecified: Secondary | ICD-10-CM | POA: Diagnosis not present

## 2024-08-12 ENCOUNTER — Encounter: Admitting: Internal Medicine

## 2024-08-12 NOTE — Progress Notes (Unsigned)
 Virtual Visit via Video Note  I connected with Miguel Craig on 08/12/24 at  7:30 AM EST by a video enabled telemedicine application and verified that I am speaking with the correct person using two identifiers.   I discussed the limitations of evaluation and management by telemedicine and the availability of in person appointments. The patient expressed understanding and agreed to proceed.   -Location of the patient : -Location of the provider : Office -The names of all persons participating in the telemedicine service : Pt and myself       Name: Miguel Craig  MRN/ DOB: [id number], [birth year]   Age/ Sex: 48 y.o., male    PCP: Miguel Krabbe, NP   Reason for Endocrinology Evaluation: Type 2 Diabetes Mellitus     Date of Initial Endocrinology Visit: 10/19/2021    PATIENT IDENTIFIER: Miguel Craig is a 48 y.o. male with a past medical history of T2DM, HTN, NASH, and OSA on CPAP . The patient presented for initial endocrinology clinic visit on 10/19/2021 for consultative assistance with his diabetes management.    HPI: Miguel Craig was    Diagnosed with DM yrs ago.             Hemoglobin A1c has ranged from 5.9% in 2022, peaking at % in 2023. Patient required assistance for hypoglycemia: yes - in 2017   Transitioned care from Dr. Beryl 09/2021.   On his initial visit to our clinic his A1c was 6.0 % on Trulicity  and Synjary but had to switch Synjardy to Metforin and Jardiance  due to insurance changes    Switched Trulicity  to Mounjaro  07/2023  Discontinued Metformin  with an A1c 5.8% 10/2023   HTN HISTORY: Due to history of uncontrolled HTN and hypokalemia he was screened for Cushing syndrome and hyperaldosteronism.  Patient was noted with suppressed renin activity <0.167 , elevated Aldo/renin ratio >41.3, and an aldosterone level of 6.9.  Saliva cortisol was performed, but the patient was only able to submit 1 sample which was abnormal at 0.12 (reference 0.09) in  08/2023  Dexamethasone  suppression test was slightly elevated at 1.9 Mg/DL in 04/7973  24 hr urine cortisol was normal at 47.6 mcg in 10/2023  CT imaging of the abdomen 09/06/2023 revealed 2 left adrenal adenomas measuring up to 11 mm.  No further imaging indicated   Patient met with adrenal surgeon Dr. Sibyl in September, 25 which confirmed lateralization to the left adrenal gland    SUBJECTIVE:   During the last visit (02/12/2024): A1c 5.8 %     Today (08/12/24): Miguel Craig  is here for a follow up on diabetes management. He has been checking glucose occasionally   Patient did meet with Dr. Sibyl for adrenal evaluation, which confirmed lateralization to the left adrenal gland.  Patient is scheduled for robot-assisted adrenalectomy on 09/05/2024  Patient follows with pulmonary for OSA on CPAP   Denies nausea or vomiting  Denies constipation or diarrhea  Denies chest pain or sob  Has  occasional  LE edema with prolonged walking Denies dizziness at this time Has not been checking BP at home   HOME DIABETES REGIMEN: Mounjaro  7.5 mg weekly  Jardiance  25 mg weekly     Statin: yes ACE-I/ARB: yes    METER DOWNLOAD SUMMARY: n/a   DIABETIC COMPLICATIONS: Microvascular complications:   Denies: CKD retinopathy, neuropathy  Last eye exam: 09/2021  Macrovascular complications:   Denies: CAD, PVD, CVA   PAST HISTORY: Past Medical History:  Past Medical History:  Diagnosis  Date   Acquired trigger finger of right index finger 10/25/2021   Asthma    Confusion 11/08/2016   Formatting of this note might be different from the original. ---Jan 2016-Sleep Study---Obstructive Sleep Apnea  ---Jan 2018-MRI---No acute intracranial abnormality. Partially empty sella. Mild proptosis minimal paranasal sinus   Cyst on ear    left   Diabetes mellitus without complication (HCC)    Dizziness 10/19/2021   Dyslipidemia 10/19/2021   Elevated liver enzymes 01/05/2016   Formatting  of this note might be different from the original. US  liver ordered 01/05/16 Findings consistent with nonalcoholic fatty liver disease, lifestyle interventions discussed with the patient 02/02/16 Hep screening negative   Epidermoid cyst of ear 08/26/2018   Hyperlipidemia    Hypertension    Hypokalemia 09/16/2016   Internal and external bleeding hemorrhoids 11/13/2018   Memory loss 11/08/2016   Formatting of this note might be different from the original. ---Jan 2016-Sleep Study---Obstructive Sleep Apnea  ---Jan 2018-MRI---No acute intracranial abnormality. Partially empty sella. Mild proptosis minimal paranasal sinus disease   Sleep apnea    uses CPAP nightly   Past Surgical History:  Past Surgical History:  Procedure Laterality Date   ANKLE SURGERY Left    achilles tendon    Social History:  reports that he has been smoking cigarettes. He has a 16 pack-year smoking history. He has never used smokeless tobacco. He reports that he does not currently use alcohol. He reports that he does not use drugs. Family History:  Family History  Problem Relation Age of Onset   Hypertension Mother    Diabetes Father    Hyperlipidemia Father    Colon cancer Neg Hx    Esophageal cancer Neg Hx    Pancreatic cancer Neg Hx    Stomach cancer Neg Hx    Liver disease Neg Hx    Colon polyps Neg Hx    Rectal cancer Neg Hx      HOME MEDICATIONS: Allergies as of 08/12/2024       Reactions   Narcan  [naloxone ] Other (See Comments)   Lethargic, sleepy, disoriented, altered mental status   Hydrocodone -acetaminophen  Nausea Only   Penicillins Other (See Comments)        Medication List        Accurate as of August 12, 2024  7:00 AM. If you have any questions, ask your nurse or doctor.          amLODipine  10 MG tablet Commonly known as: NORVASC  Take 1 tablet (10 mg total) by mouth daily.   aspirin  EC 81 MG tablet Take 1 tablet (81 mg total) by mouth daily. Swallow whole.   atorvastatin   40 MG tablet Commonly known as: LIPITOR TAKE 1 TABLET BY MOUTH EVERY DAY   cholecalciferol 25 MCG (1000 UNIT) tablet Commonly known as: VITAMIN D3 Take 1,000 Units by mouth daily.   empagliflozin  25 MG Tabs tablet Commonly known as: Jardiance  Take 1 tablet (25 mg total) by mouth daily before breakfast.   metoprolol  succinate 25 MG 24 hr tablet Commonly known as: TOPROL -XL Take 1 tablet (25 mg total) by mouth at bedtime.   Mounjaro  7.5 MG/0.5ML Pen Generic drug: tirzepatide  INJECT 7.5 MG SUBCUTANEOUSLY WEEKLY   olmesartan  40 MG tablet Commonly known as: BENICAR  Take 1 tablet (40 mg total) by mouth daily.   OneTouch Verio test strip Generic drug: glucose blood 1 EACH BY OTHER ROUTE DAILY IN THE AFTERNOON. USE AS INSTRUCTED   glucose blood test strip SMARTSIG:1 Strip(s) Via Meter Daily  OneTouch Verio w/Device Kit 1 Device by Does not apply route daily in the afternoon.   potassium chloride  SA 20 MEQ tablet Commonly known as: KLOR-CON  M TAKE 1 TABLET BY MOUTH EVERY DAY         ALLERGIES: Allergies  Allergen Reactions   Narcan  [Naloxone ] Other (See Comments)    Lethargic, sleepy, disoriented, altered mental status   Hydrocodone -Acetaminophen  Nausea Only   Penicillins Other (See Comments)     REVIEW OF SYSTEMS: A comprehensive ROS was conducted with the patient and is negative except as per HPI     OBJECTIVE:   VITAL SIGNS: There were no vitals taken for this visit.    There were no vitals filed for this visit.    PHYSICAL EXAM:  General: Pt appears well and is in NAD  Neuro: MS is good with appropriate affect, pt is alert and Ox3    DM foot exam: 08/09/2023  The skin of the feet is intact without sores or ulcerations. The pedal pulses are 2+ on right and 2+ on left. The sensation is intact to a screening 5.07, 10 gram monofilament bilaterally    DATA REVIEWED:  Lab Results  Component Value Date   HGBA1C 5.5 02/12/2024   HGBA1C 5.8 (A)  11/08/2023   HGBA1C 6.2 (A) 08/09/2023    Latest Reference Range & Units 02/12/24 08:04  Sodium 135 - 146 mmol/L 142  Potassium 3.5 - 5.3 mmol/L 3.3 (L)  Chloride 98 - 110 mmol/L 105  CO2 20 - 32 mmol/L 28  Glucose 65 - 99 mg/dL 88  BUN 7 - 25 mg/dL 12  Creatinine 9.39 - 8.70 mg/dL 8.93  Calcium  8.6 - 10.3 mg/dL 9.1  BUN/Creatinine Ratio 6 - 22 (calc) SEE NOTE:  eGFR > OR = 60 mL/min/1.83m2 87  Total CHOL/HDL Ratio <5.0 (calc) 3.3  Cholesterol <200 mg/dL 882  HDL Cholesterol > OR = 40 mg/dL 36 (L)  LDL Cholesterol (Calc) mg/dL (calc) 62  Non-HDL Cholesterol (Calc) <130 mg/dL (calc) 81  Triglycerides <150 mg/dL 896       Latest Reference Range & Units 08/09/23 07:55 10/15/23 08:32  ALDOSTERONE 0.0 - 30.0 ng/dL 6.9   ALDOS/RENIN RATIO 0.0 - 30.0  >41.3 (H)     Latest Reference Range & Units 08/09/23 07:55  Renin Activity, Plasma 0.167 - 5.380 ng/mL/hr <0.167 (L)   Dexamethasone  Suppression test   Latest Reference Range & Units 10/15/23 08:32  Cortisol, Plasma mcg/dL 1.9 (L)      Latest Reference Range & Units 08/09/23 07:55  Sodium 135 - 145 mEq/L 142  Potassium 3.5 - 5.1 mEq/L 3.5  Chloride 96 - 112 mEq/L 103  CO2 19 - 32 mEq/L 31  Glucose 70 - 99 mg/dL 893 (H)  BUN 6 - 23 mg/dL 13  Creatinine 9.59 - 8.49 mg/dL 8.84  Calcium  8.4 - 10.5 mg/dL 9.4  GFR >39.99 mL/min 76.21    Latest Reference Range & Units 08/09/23 07:55  TSH 0.35 - 5.50 uIU/mL 1.49  T4,Free(Direct) 0.60 - 1.60 ng/dL 9.08        AVS 0/7974     Cortisol Aldosterone  LAV 181.9 971  RAV 757.1 1120  IVC 11.8 17    Selectivity index:     Left  = 15  Right= 64                              Which confirms proper placement of the catheter  into the adrenal veins     Corrected aldosterone levels:   Left= 5.33 Right= 1.48       Lateralization index:   3.6     It appears that he has lateralization towards the left adrenal gland, which is expected with 2 nodules on the left adrenal  gland.     CT Abdomen 09/06/2023  Adrenals/Urinary Tract: Enhancing 11 mm left adrenal nodule on image 50/5 measures Hounsfield units of 9 pre contrast administration compatible with an adrenal adenoma. Enhancing 10 mm nodule in the inferior margin of the left adrenal gland on image 61/5 measures Hounsfield units of -2 pre contrast administration compatible with an adrenal adenoma.   ASSESSMENT / PLAN / RECOMMENDATIONS:   1) Type 2 Diabetes Mellitus, Optimally controlled, Without complications - Most recent A1c of 5.8 %. Goal A1c < 7.0 %.    -His A1c has been optimal  -He continues with weight loss -We discontinued Metformin  due to an A1c 5.8%    MEDICATIONS: Continue Mounjaro  7.5 mg weekly Continue Jardiance  25 mg daily  EDUCATION / INSTRUCTIONS: BG monitoring instructions: Patient is instructed to check his blood sugars 1 times a day, fasting . Call [facility] if: BG persistently < 70  I reviewed the Rule of 15 for the treatment of hypoglycemia in detail with the patient. Literature supplied.   2) Diabetic complications:  Eye: Does not have known diabetic retinopathy.  Neuro/ Feet: Does not have known diabetic peripheral neuropathy. Renal: Patient does not have known baseline CKD. He is  on an ACEI/ARB at present.     3) Dyslipidemia :  - Lipid panel acceptable  Medication Continue atorvastatin  40 mg daily   4)Left Adrenal Adenoma   -Patient with history of uncontrolled HTN, and hypokalemia -His renin was suppressed with elevated Aldo/renin while on olmesartan , no confirmation needed -Patient had 1 sample of saliva cortisol submitted (instead of 2) which was abnormal, dexamethasone  suppression test was slightly elevated at 1.9 - 24-hour urinary cortisol was normal and catecholamines showed slight elevated of epinephrine at 25 mcg with elevated creatinine - Will proceed with AVS , a referral has been placed to interventional radiology     5) Hypokalemia:  - Will replenish  Medication KCl 20 mill equivalent daily  F/U 6 months    Signed electronically by: Miguel Craig Butts, MD  [facility]  [facility] [address] [place] [place], [place] [address] Phone: [phone] FAX: [phone]   CC: Miguel Krabbe, NP [address] [place] [address] Phone: [phone]  Fax: [phone]    Return to Endocrinology clinic as below: Future Appointments  Date Time Provider Department Center  08/12/2024  7:30 AM Miguel Craig, Miguel Redgie, MD [facility] None  09/10/2024  8:30 AM Miguel Krabbe, NP [facility] Miguel Craig

## 2024-08-20 DIAGNOSIS — E119 Type 2 diabetes mellitus without complications: Secondary | ICD-10-CM | POA: Diagnosis not present

## 2024-08-20 DIAGNOSIS — Q8789 Other specified congenital malformation syndromes, not elsewhere classified: Secondary | ICD-10-CM | POA: Diagnosis not present

## 2024-08-20 DIAGNOSIS — Z7984 Long term (current) use of oral hypoglycemic drugs: Secondary | ICD-10-CM | POA: Diagnosis not present

## 2024-08-20 DIAGNOSIS — E278 Other specified disorders of adrenal gland: Secondary | ICD-10-CM | POA: Diagnosis not present

## 2024-08-20 DIAGNOSIS — Z7985 Long-term (current) use of injectable non-insulin antidiabetic drugs: Secondary | ICD-10-CM | POA: Diagnosis not present

## 2024-08-20 DIAGNOSIS — Z01818 Encounter for other preprocedural examination: Secondary | ICD-10-CM | POA: Diagnosis not present

## 2024-08-20 DIAGNOSIS — G4733 Obstructive sleep apnea (adult) (pediatric): Secondary | ICD-10-CM | POA: Diagnosis not present

## 2024-08-20 DIAGNOSIS — I10 Essential (primary) hypertension: Secondary | ICD-10-CM | POA: Diagnosis not present

## 2024-08-20 DIAGNOSIS — D3502 Benign neoplasm of left adrenal gland: Secondary | ICD-10-CM | POA: Diagnosis not present

## 2024-09-05 DIAGNOSIS — E119 Type 2 diabetes mellitus without complications: Secondary | ICD-10-CM | POA: Diagnosis not present

## 2024-09-05 DIAGNOSIS — R569 Unspecified convulsions: Secondary | ICD-10-CM | POA: Diagnosis not present

## 2024-09-05 DIAGNOSIS — E66812 Obesity, class 2: Secondary | ICD-10-CM | POA: Diagnosis not present

## 2024-09-05 DIAGNOSIS — E278 Other specified disorders of adrenal gland: Secondary | ICD-10-CM | POA: Diagnosis not present

## 2024-09-05 DIAGNOSIS — R29818 Other symptoms and signs involving the nervous system: Secondary | ICD-10-CM | POA: Diagnosis not present

## 2024-09-05 DIAGNOSIS — G4733 Obstructive sleep apnea (adult) (pediatric): Secondary | ICD-10-CM | POA: Diagnosis not present

## 2024-09-05 DIAGNOSIS — E2609 Other primary hyperaldosteronism: Secondary | ICD-10-CM | POA: Diagnosis not present

## 2024-09-05 DIAGNOSIS — Z9284 Personal history of unintended awareness under general anesthesia: Secondary | ICD-10-CM | POA: Diagnosis not present

## 2024-09-05 DIAGNOSIS — Z79899 Other long term (current) drug therapy: Secondary | ICD-10-CM | POA: Diagnosis not present

## 2024-09-05 DIAGNOSIS — E249 Cushing's syndrome, unspecified: Secondary | ICD-10-CM | POA: Diagnosis not present

## 2024-09-05 DIAGNOSIS — Z7982 Long term (current) use of aspirin: Secondary | ICD-10-CM | POA: Diagnosis not present

## 2024-09-05 DIAGNOSIS — D3502 Benign neoplasm of left adrenal gland: Secondary | ICD-10-CM | POA: Diagnosis not present

## 2024-09-05 DIAGNOSIS — E876 Hypokalemia: Secondary | ICD-10-CM | POA: Diagnosis not present

## 2024-09-05 DIAGNOSIS — F17210 Nicotine dependence, cigarettes, uncomplicated: Secondary | ICD-10-CM | POA: Diagnosis not present

## 2024-09-05 DIAGNOSIS — Z7984 Long term (current) use of oral hypoglycemic drugs: Secondary | ICD-10-CM | POA: Diagnosis not present

## 2024-09-05 DIAGNOSIS — Z6838 Body mass index (BMI) 38.0-38.9, adult: Secondary | ICD-10-CM | POA: Diagnosis not present

## 2024-09-06 DIAGNOSIS — E876 Hypokalemia: Secondary | ICD-10-CM | POA: Diagnosis not present

## 2024-09-06 DIAGNOSIS — E2609 Other primary hyperaldosteronism: Secondary | ICD-10-CM | POA: Diagnosis not present

## 2024-09-06 DIAGNOSIS — R569 Unspecified convulsions: Secondary | ICD-10-CM | POA: Diagnosis not present

## 2024-09-06 DIAGNOSIS — Z7984 Long term (current) use of oral hypoglycemic drugs: Secondary | ICD-10-CM | POA: Diagnosis not present

## 2024-09-06 DIAGNOSIS — D3502 Benign neoplasm of left adrenal gland: Secondary | ICD-10-CM | POA: Diagnosis not present

## 2024-09-06 DIAGNOSIS — Z79899 Other long term (current) drug therapy: Secondary | ICD-10-CM | POA: Diagnosis not present

## 2024-09-06 DIAGNOSIS — R29818 Other symptoms and signs involving the nervous system: Secondary | ICD-10-CM | POA: Diagnosis not present

## 2024-09-06 DIAGNOSIS — E119 Type 2 diabetes mellitus without complications: Secondary | ICD-10-CM | POA: Diagnosis not present

## 2024-09-06 DIAGNOSIS — F17210 Nicotine dependence, cigarettes, uncomplicated: Secondary | ICD-10-CM | POA: Diagnosis not present

## 2024-09-06 DIAGNOSIS — E249 Cushing's syndrome, unspecified: Secondary | ICD-10-CM | POA: Diagnosis not present

## 2024-09-06 DIAGNOSIS — Z9284 Personal history of unintended awareness under general anesthesia: Secondary | ICD-10-CM | POA: Diagnosis not present

## 2024-09-06 DIAGNOSIS — G4733 Obstructive sleep apnea (adult) (pediatric): Secondary | ICD-10-CM | POA: Diagnosis not present

## 2024-09-06 DIAGNOSIS — E278 Other specified disorders of adrenal gland: Secondary | ICD-10-CM | POA: Diagnosis not present

## 2024-09-06 DIAGNOSIS — Z6838 Body mass index (BMI) 38.0-38.9, adult: Secondary | ICD-10-CM | POA: Diagnosis not present

## 2024-09-06 DIAGNOSIS — E66812 Obesity, class 2: Secondary | ICD-10-CM | POA: Diagnosis not present

## 2024-09-06 DIAGNOSIS — Z7982 Long term (current) use of aspirin: Secondary | ICD-10-CM | POA: Diagnosis not present

## 2024-09-10 ENCOUNTER — Encounter: Payer: Self-pay | Admitting: Family

## 2024-09-10 ENCOUNTER — Ambulatory Visit (INDEPENDENT_AMBULATORY_CARE_PROVIDER_SITE_OTHER): Admitting: Family

## 2024-09-10 VITALS — BP 123/85 | HR 83 | Temp 97.7°F | Ht 69.0 in | Wt 259.2 lb

## 2024-09-10 DIAGNOSIS — I10 Essential (primary) hypertension: Secondary | ICD-10-CM

## 2024-09-10 DIAGNOSIS — E896 Postprocedural adrenocortical (-medullary) hypofunction: Secondary | ICD-10-CM

## 2024-09-10 MED ORDER — METOPROLOL SUCCINATE ER 25 MG PO TB24: 25.0000 mg | ORAL_TABLET | Freq: Every day | ORAL | 1 refills | Status: AC

## 2024-09-10 MED ORDER — OXYCODONE HCL 5 MG PO CAPS: 5.0000 mg | ORAL_CAPSULE | ORAL | 0 refills | Status: AC | PRN

## 2024-09-10 MED ORDER — NAPROXEN 500 MG PO TABS: 500.0000 mg | ORAL_TABLET | Freq: Two times a day (BID) | ORAL | 0 refills | Status: DC

## 2024-09-10 MED ORDER — AMLODIPINE BESYLATE 10 MG PO TABS: 10.0000 mg | ORAL_TABLET | Freq: Every day | ORAL | 1 refills | Status: DC

## 2024-09-10 NOTE — Progress Notes (Signed)
 Patient ID: Miguel Craig, male    DOB: 1976-06-13, 48 y.o.   MRN: [id number]  Chief Complaint  Patient presents with   Essential hypertension    Follow up   Adrenal mass    Patient had surgery on 12/12.   Discussed the use of AI scribe software for clinical note transcription with the patient, who gave verbal consent to proceed.  History of Present Illness Miguel Craig is a 48 year old male who presents with post-surgical pain management issues and medication confusion following recent surgery.  He recently underwent surgery and has significant pain. In the hospital he received Tylenol  and oxycodone  and is worried that his outpatient prescription will not last. He has tried to contact the hospital for guidance without success. He is confused about his post-surgical medications. He was prescribed hydrocortisone but is unsure if it should be taken on a schedule or as needed, so he has not taken it. He has not taken olmesartan  since Thursday morning and is only taking amlodipine  and metoprolol  for blood pressure. He is not monitoring his blood pressure at home and is not taking a potassium supplement. He feels tired and is mostly resting at home. He is not working and reports limited transportation support for medical appointments. He is concerned about wound healing. The incisions were closed with glue and he is watching them for any openings or drainage.  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 Hydrocortisone prescribed for symptoms of low cortisol. He was unsure about dosing and timing. - Take hydrocortisone as needed for symptoms of low cortisol, symptoms reviewed and listed on his hospital d/c summary. - Take two tablets in the morning and one tablet in the afternoon, not later than 3 PM, if symptoms occur. - Keep f/u surgery appt on 12/30 with Dr Samual. -  Follow up with endocrinology on January 16th.  Postoperative pain management Postoperative pain following total adrenalectomy. Current medication regimen inadequate for pain control. Oxycodone  and naproxen  prescribed. - Prescribed additional oxycodone  5mg , 6 pills for severe pain management. - Prescribed naproxen  twice daily for anti-inflammatory, moderate pain management. - Advised use of heat for pain relief, up to 30 minutes 3x/day prn. - Keep f/u surgery appt on 12/30.  Essential hypertension Hypertension well-controlled with amlodipine  and metoprolol . Olmesartan  held due to confusion on dose. BP in good range today. He is not checking blood pressure at home. - Continue amlodipine  10mg  qam and metoprolol  25mg  ER qpm, refills sent. - Held olmesartan  until further evaluation. - Monitor blood pressure at home and notify office if  >140/90 either number. - F/U in 4 mos  Hypokalemia Noted post-surgery. He is taking potassium supplement daily. - Continue potassium supplementation as directed.   Subjective:    Outpatient Medications Prior to Visit  Medication Sig Dispense Refill   amLODipine  (NORVASC ) 10 MG tablet Take 1 tablet (10 mg total) by mouth daily. 90 tablet 1   aspirin  EC 81 MG tablet Take 1 tablet (81 mg total) by mouth daily. Swallow whole. 90 tablet 3   atorvastatin  (LIPITOR) 40 MG tablet TAKE 1 TABLET BY MOUTH EVERY DAY 90 tablet 3   Blood Glucose Monitoring Suppl (ONETOUCH VERIO) w/Device KIT 1 Device by Does not apply route daily in the afternoon. 1 kit 0   cholecalciferol (VITAMIN D3) 25 MCG (1000 UNIT) tablet Take 1,000 Units by mouth daily.     empagliflozin  (JARDIANCE ) 25 MG TABS tablet Take 1 tablet (25 mg total)  by mouth daily before breakfast. 90 tablet 3   glucose blood test strip SMARTSIG:1 Strip(s) Via Meter Daily     hydrocortisone (CORTEF) 5 MG tablet If you have symptoms of adrenal insufficiency as discussed in discharge paperwork, take 2 tablets (10mg ) in  the morning and 1 tablet (5 mg) in the afternoon (Not later than 3 pm), until seen by endocrinology     metoprolol  succinate (TOPROL -XL) 25 MG 24 hr tablet Take 1 tablet (25 mg total) by mouth at bedtime. 90 tablet 1   ONETOUCH VERIO test strip 1 EACH BY OTHER ROUTE DAILY IN THE AFTERNOON. USE AS INSTRUCTED 100 strip 3   oxyCODONE  (OXY IR/ROXICODONE ) 5 MG immediate release tablet Take 5-10 mg by mouth.     potassium chloride  SA (KLOR-CON  M) 20 MEQ tablet TAKE 1 TABLET BY MOUTH EVERY DAY 90 tablet 1   tirzepatide  (MOUNJARO ) 7.5 MG/0.5ML Pen INJECT 7.5 MG SUBCUTANEOUSLY WEEKLY 6 mL 1   olmesartan  (BENICAR ) 40 MG tablet Take 1 tablet (40 mg total) by mouth daily. (Patient not taking: Reported on 09/10/2024) 90 tablet 1   No facility-administered medications prior to visit.   Past Medical History:  Diagnosis Date   Acquired trigger finger of right index finger 10/25/2021   Asthma    Confusion 11/08/2016   Formatting of this note might be different from the original. ---Jan 2016-Sleep Study---Obstructive Sleep Apnea  ---Jan 2018-MRI---No acute intracranial abnormality. Partially empty sella. Mild proptosis minimal paranasal sinus   Cyst on ear    left   Diabetes mellitus without complication (HCC)    Dizziness 10/19/2021   Dyslipidemia 10/19/2021   Elevated liver enzymes 01/05/2016   Formatting of this note might be different from the original. US  liver ordered 01/05/16 Findings consistent with nonalcoholic fatty liver disease, lifestyle interventions discussed with the patient 02/02/16 Hep screening negative   Epidermoid cyst of ear 08/26/2018   Hyperlipidemia    Hypertension    Hypokalemia 09/16/2016   Internal and external bleeding hemorrhoids 11/13/2018   Memory loss 11/08/2016   Formatting of this note might be different from the original. ---Jan 2016-Sleep Study---Obstructive Sleep Apnea  ---Jan 2018-MRI---No acute intracranial abnormality. Partially empty sella. Mild proptosis minimal  paranasal sinus disease   Sleep apnea    uses CPAP nightly   Past Surgical History:  Procedure Laterality Date   ANKLE SURGERY Left    achilles tendon   Allergies[1]    Objective:    Physical Exam Vitals and nursing note reviewed.  Constitutional:      General: He is not in acute distress.    Appearance: Normal appearance.  HENT:     Head: Normocephalic.  Cardiovascular:     Rate and Rhythm: Normal rate and regular rhythm.  Pulmonary:     Effort: Pulmonary effort is normal.     Breath sounds: Normal breath sounds.  Musculoskeletal:        General: Normal range of motion.     Cervical back: Normal range of motion.  Skin:    General: Skin is warm and dry.  Neurological:     Mental Status: He is alert and oriented to person, place, and time.  Psychiatric:        Mood and Affect: Mood normal.    BP 123/85 (BP Location: Left Arm, Patient Position: Sitting, Cuff Size: Normal)   Pulse 83   Temp 97.7 F (36.5 C) (Temporal)   Ht 5' 9 (1.753 m)   Wt 259 lb 3.2 oz (117.6 kg)  SpO2 95%   BMI 38.28 kg/m  Wt Readings from Last 3 Encounters:  09/10/24 259 lb 3.2 oz (117.6 kg)  07/23/24 258 lb 12.8 oz (117.4 kg)  05/09/24 255 lb 2 oz (115.7 kg)      Lucius Krabbe, NP     [1]  Allergies Allergen Reactions   Narcan  [Naloxone ] Other (See Comments)    Lethargic, sleepy, disoriented, altered mental status   Hydrocodone -Acetaminophen  Nausea Only   Penicillins Other (See Comments)

## 2024-09-20 ENCOUNTER — Other Ambulatory Visit: Payer: Self-pay | Admitting: Family

## 2024-09-20 DIAGNOSIS — E896 Postprocedural adrenocortical (-medullary) hypofunction: Secondary | ICD-10-CM

## 2024-09-23 ENCOUNTER — Encounter: Payer: Self-pay | Admitting: Internal Medicine

## 2024-09-23 ENCOUNTER — Ambulatory Visit: Admitting: Internal Medicine

## 2024-09-23 VITALS — BP 132/82 | Ht 69.0 in | Wt 267.0 lb

## 2024-09-23 DIAGNOSIS — Q8789 Other specified congenital malformation syndromes, not elsewhere classified: Secondary | ICD-10-CM | POA: Diagnosis not present

## 2024-09-23 DIAGNOSIS — E249 Cushing's syndrome, unspecified: Secondary | ICD-10-CM | POA: Diagnosis not present

## 2024-09-23 DIAGNOSIS — Z7985 Long-term (current) use of injectable non-insulin antidiabetic drugs: Secondary | ICD-10-CM | POA: Diagnosis not present

## 2024-09-23 DIAGNOSIS — E785 Hyperlipidemia, unspecified: Secondary | ICD-10-CM | POA: Diagnosis not present

## 2024-09-23 DIAGNOSIS — Z7984 Long term (current) use of oral hypoglycemic drugs: Secondary | ICD-10-CM

## 2024-09-23 DIAGNOSIS — D3502 Benign neoplasm of left adrenal gland: Secondary | ICD-10-CM | POA: Diagnosis not present

## 2024-09-23 DIAGNOSIS — E119 Type 2 diabetes mellitus without complications: Secondary | ICD-10-CM

## 2024-09-23 DIAGNOSIS — E896 Postprocedural adrenocortical (-medullary) hypofunction: Secondary | ICD-10-CM

## 2024-09-23 DIAGNOSIS — E2609 Other primary hyperaldosteronism: Secondary | ICD-10-CM

## 2024-09-23 DIAGNOSIS — I10 Essential (primary) hypertension: Secondary | ICD-10-CM

## 2024-09-23 LAB — POCT GLYCOSYLATED HEMOGLOBIN (HGB A1C): Hemoglobin A1C: 6.1 % — AB (ref 4.0–5.6)

## 2024-09-23 MED ORDER — AMLODIPINE BESYLATE 10 MG PO TABS: 5.0000 mg | ORAL_TABLET | Freq: Every day | ORAL | Status: DC

## 2024-09-23 MED ORDER — ONETOUCH VERIO VI STRP: 1.0000 | ORAL_STRIP | Freq: Every day | 3 refills | Status: DC

## 2024-09-23 NOTE — Patient Instructions (Signed)
 Stop potassium Decrease amlodipine  10 mg, half a tablet daily

## 2024-09-23 NOTE — Progress Notes (Unsigned)
 " Name: Miguel Craig  MRN/ DOB: [id number], 09-19-1976   Age/ Sex: 48 y.o., male    PCP: Lucius Krabbe, NP   Reason for Endocrinology Evaluation: Type 2 Diabetes Mellitus     Date of Initial Endocrinology Visit: 10/19/2021    PATIENT IDENTIFIER: [name] is a 48 y.o. male with a past medical history of T2DM, HTN, NASH, and OSA on CPAP . The patient presented for initial endocrinology clinic visit on 10/19/2021 for consultative assistance with his diabetes management.    HPI: Mr. Lucchesi was    Diagnosed with DM yrs ago.             Hemoglobin A1c has ranged from 5.9% in 2022, peaking at % in 2023. Patient required assistance for hypoglycemia: yes - in 2017   Transitioned care from Dr. Beryl 09/2021.   On his initial visit to our clinic his A1c was 6.0 % on Trulicity  and Synjary but had to switch Synjardy to Metforin and Jardiance  due to insurance changes    Switched Trulicity  to Mounjaro  07/2023  Discontinued Metformin  with an A1c 5.8% 10/2023   HTN HISTORY: Due to history of uncontrolled HTN and hypokalemia he was screened for Cushing syndrome and hyperaldosteronism.  Patient was noted with suppressed renin activity <0.167 , elevated Aldo/renin ratio >41.3, and an aldosterone level of 6.9.  Saliva cortisol was performed, but the patient was only able to submit 1 sample which was abnormal at 0.12 (reference 0.09) in 08/2023  Dexamethasone  suppression test was slightly elevated at 1.9 Mg/DL in 04/7973  24 hr urine cortisol was normal at 47.6 mcg in 10/2023  CT imaging of the abdomen 09/06/2023 revealed 2 left adrenal adenomas measuring up to 11 mm.  No further imaging indicated   Patient is s/p robotic LEFT  adrenalectomy 09/05/2024   SUBJECTIVE:   During the last visit (11/08/2023): A1c 5.8 %     Today (09/23/2024): [name]  is here for a follow up on diabetes management. He has been checking glucose occasionally    Patient is s/p left  adrenalectomy on 09/05/2024.  He was discharged on hydrocortisone, but the patient had only taken 1 tablet. Since he has been home postoperatively, patient has been bored and admits to drinking sodas and snacking, but he is going back to work soon and will return to his normal routine No nausea  Has noted with dizziness since his sx  No headaches No constipation or diarrhea   HOME DIABETES REGIMEN: Mounjaro  7.5 mg weekly  Jardiance  25 mg weekly     Statin: yes ACE-I/ARB: yes    METER DOWNLOAD SUMMARY: n/a   DIABETIC COMPLICATIONS: Microvascular complications:   Denies: CKD retinopathy, neuropathy  Last eye exam: 10/23/2023  Macrovascular complications:   Denies: CAD, PVD, CVA   PAST HISTORY: Past Medical History:  Past Medical History:  Diagnosis Date   Acquired trigger finger of right index finger 10/25/2021   Asthma    Confusion 11/08/2016   Formatting of this note might be different from the original. ---Jan 2016-Sleep Study---Obstructive Sleep Apnea  ---Jan 2018-MRI---No acute intracranial abnormality. Partially empty sella. Mild proptosis minimal paranasal sinus   Cyst on ear    left   Diabetes mellitus without complication (HCC)    Dizziness 10/19/2021   Dyslipidemia 10/19/2021   Elevated liver enzymes 01/05/2016   Formatting of this note might be different from the original. US  liver ordered 01/05/16 Findings consistent with nonalcoholic fatty liver disease, lifestyle interventions discussed with the patient  02/02/16 Hep screening negative   Epidermoid cyst of ear 08/26/2018   Hyperlipidemia    Hypertension    Hypokalemia 09/16/2016   Internal and external bleeding hemorrhoids 11/13/2018   Memory loss 11/08/2016   Formatting of this note might be different from the original. ---Jan 2016-Sleep Study---Obstructive Sleep Apnea  ---Jan 2018-MRI---No acute intracranial abnormality. Partially empty sella. Mild proptosis minimal paranasal sinus disease   Sleep apnea     uses CPAP nightly   Past Surgical History:  Past Surgical History:  Procedure Laterality Date   ANKLE SURGERY Left    achilles tendon    Social History:  reports that he has been smoking cigarettes. He has a 16 pack-year smoking history. He has never used smokeless tobacco. He reports that he does not currently use alcohol. He reports that he does not use drugs. Family History:  Family History  Problem Relation Age of Onset   Hypertension Mother    Diabetes Father    Hyperlipidemia Father    Colon cancer Neg Hx    Esophageal cancer Neg Hx    Pancreatic cancer Neg Hx    Stomach cancer Neg Hx    Liver disease Neg Hx    Colon polyps Neg Hx    Rectal cancer Neg Hx      HOME MEDICATIONS: Allergies as of 09/23/2024       Reactions   Narcan  [naloxone ] Other (See Comments)   Lethargic, sleepy, disoriented, altered mental status   Hydrocodone -acetaminophen  Nausea Only   Penicillins Other (See Comments)        Medication List        Accurate as of September 23, 2024  1:55 PM. If you have any questions, ask your nurse or doctor.          amLODipine  10 MG tablet Commonly known as: NORVASC  Take 1 tablet (10 mg total) by mouth daily.   aspirin  EC 81 MG tablet Take 1 tablet (81 mg total) by mouth daily. Swallow whole.   atorvastatin  40 MG tablet Commonly known as: LIPITOR TAKE 1 TABLET BY MOUTH EVERY DAY   cholecalciferol 25 MCG (1000 UNIT) tablet Commonly known as: VITAMIN D3 Take 1,000 Units by mouth daily.   empagliflozin  25 MG Tabs tablet Commonly known as: Jardiance  Take 1 tablet (25 mg total) by mouth daily before breakfast.   glucose blood test strip SMARTSIG:1 Strip(s) Via Meter Daily   OneTouch Verio test strip Generic drug: glucose blood 1 each by Other route daily in the afternoon. Use as instructed   hydrocortisone 5 MG tablet Commonly known as: CORTEF If you have symptoms of adrenal insufficiency as discussed in discharge paperwork, take 2  tablets (10mg ) in the morning and 1 tablet (5 mg) in the afternoon (Not later than 3 pm), until seen by endocrinology   metoprolol  succinate 25 MG 24 hr tablet Commonly known as: TOPROL -XL Take 1 tablet (25 mg total) by mouth at bedtime.   Mounjaro  7.5 MG/0.5ML Pen Generic drug: tirzepatide  INJECT 7.5 MG SUBCUTANEOUSLY WEEKLY   naproxen  500 MG tablet Commonly known as: NAPROSYN  TAKE 1 TABLET BY MOUTH 2 TIMES DAILY WITH A MEAL.   OneTouch Verio w/Device Kit 1 Device by Does not apply route daily in the afternoon.   oxycodone  5 MG capsule Commonly known as: OXY-IR Take 1 capsule (5 mg total) by mouth every 4 (four) hours as needed (Take after eating.).   potassium chloride  SA 20 MEQ tablet Commonly known as: KLOR-CON  M TAKE 1 TABLET BY MOUTH  EVERY DAY         ALLERGIES: Allergies  Allergen Reactions   Narcan  [Naloxone ] Other (See Comments)    Lethargic, sleepy, disoriented, altered mental status   Hydrocodone -Acetaminophen  Nausea Only   Penicillins Other (See Comments)     REVIEW OF SYSTEMS: A comprehensive ROS was conducted with the patient and is negative except as per HPI     OBJECTIVE:   VITAL SIGNS: BP 132/82   Ht 5' 9 (1.753 m)   Wt 267 lb (121.1 kg)   BMI 39.43 kg/m     Filed Weights   09/23/24 1335  Weight: 267 lb (121.1 kg)      PHYSICAL EXAM:  General: Pt appears well and is in NAD  Lungs: Clear with good BS bilat   Heart: RRR   Abdomen: soft, nontender  Extremities: Trace  pretibial edema  Neuro: MS is good with appropriate affect, pt is alert and Ox3    DM foot exam: 09/23/2024  The skin of the feet is intact without sores or ulcerations. The pedal pulses are 2+ on right and 2+ on left. The sensation is intact to a screening 5.07, 10 gram monofilament bilaterally    DATA REVIEWED:  Lab Results  Component Value Date   HGBA1C 5.5 02/12/2024   HGBA1C 5.8 (A) 11/08/2023   HGBA1C 6.2 (A) 08/09/2023   09/23/2024 through  [facility] Sodium 140 Potassium 4.3 BUN 15 Creatinine 1.2 GFR 75   ASSESSMENT / PLAN / RECOMMENDATIONS:   1) Type 2 Diabetes Mellitus, Optimally controlled, Without complications - Most recent A1c of 6.1%. Goal A1c < 7.0 %.    -His A1c has been optimal  -He continues with weight loss -We discontinued Metformin  due to an A1c 5.8%    MEDICATIONS: Continue Mounjaro  7.5 mg weekly Continue Jardiance  25 mg daily  EDUCATION / INSTRUCTIONS: BG monitoring instructions: Patient is instructed to check his blood sugars 1 times a day, fasting . Call [facility] if: BG persistently < 70  I reviewed the Rule of 15 for the treatment of hypoglycemia in detail with the patient. Literature supplied.   2) Diabetic complications:  Eye: Does not have known diabetic retinopathy.  Neuro/ Feet: Does not have known diabetic peripheral neuropathy. Renal: Patient does not have known baseline CKD. He is  on an ACEI/ARB at present.     3) Dyslipidemia :  - Tolerating atorvastatin , no changes at this time  Medication Continue atorvastatin  40 mg daily   4) s/p left adrenalectomy   -Patient with history of uncontrolled HTN, and hypokalemia -His renin was suppressed with elevated Aldo/renin while on olmesartan , no confirmation needed -Patient had 1 sample of saliva cortisol submitted (instead of 2) which was abnormal, dexamethasone  suppression test was slightly elevated at 1.9 - 24-hour urinary cortisol was normal and catecholamines showed slight elevated of epinephrine at 25 mcg with elevated creatinine - Will proceed with AVS , a referral has been placed to interventional radiology    F/U 6 months    Signed electronically by: Stefano Redgie Butts, MD  [facility]  [facility] [address] [place] [place], [place] [address] Phone: [phone] FAX: [phone]   CC: Lucius Krabbe, NP [address] [place] [address] Phone:  [phone]  Fax: [phone]    Return to Endocrinology clinic as below: Future Appointments  Date Time Provider Department Center  11/27/2024  8:20 AM Thukkani, Arun K, MD [facility]  01/13/2025  7:30 AM Lucius Krabbe, NP  [facility]     "

## 2024-09-24 DIAGNOSIS — E896 Postprocedural adrenocortical (-medullary) hypofunction: Secondary | ICD-10-CM | POA: Insufficient documentation

## 2024-10-21 ENCOUNTER — Ambulatory Visit: Admitting: Internal Medicine

## 2024-10-24 ENCOUNTER — Ambulatory Visit: Admitting: Internal Medicine

## 2024-10-24 ENCOUNTER — Other Ambulatory Visit

## 2024-10-24 ENCOUNTER — Encounter: Payer: Self-pay | Admitting: Internal Medicine

## 2024-10-24 VITALS — BP 128/88 | HR 77 | Ht 69.0 in | Wt 266.0 lb

## 2024-10-24 DIAGNOSIS — E896 Postprocedural adrenocortical (-medullary) hypofunction: Secondary | ICD-10-CM

## 2024-10-24 DIAGNOSIS — I10 Essential (primary) hypertension: Secondary | ICD-10-CM

## 2024-10-24 DIAGNOSIS — R42 Dizziness and giddiness: Secondary | ICD-10-CM

## 2024-10-24 DIAGNOSIS — E119 Type 2 diabetes mellitus without complications: Secondary | ICD-10-CM

## 2024-10-24 MED ORDER — COSYNTROPIN 0.25 MG IJ SOLR
0.2500 mg | Freq: Once | INTRAMUSCULAR | Status: AC
  Administered 2024-10-24: 0.25 mg via INTRAMUSCULAR

## 2024-10-24 MED ORDER — ONETOUCH VERIO VI STRP: 1.0000 | ORAL_STRIP | Freq: Every day | 3 refills | Status: AC

## 2024-10-24 NOTE — Progress Notes (Unsigned)
 " Name: Miguel Craig  MRN/ DOB: [id number], 24-Apr-1976   Age/ Sex: 49 y.o., male    PCP: Lucius Krabbe, NP   Reason for Endocrinology Evaluation: Type 2 Diabetes Mellitus     Date of Initial Endocrinology Visit: 10/19/2021    PATIENT IDENTIFIER: Miguel Craig is a 49 y.o. male with a past medical history of T2DM, HTN, NASH, and OSA on CPAP . The patient presented for initial endocrinology clinic visit on 10/19/2021 for consultative assistance with his diabetes management.    HPI: Miguel Craig was    Diagnosed with DM yrs ago.             Hemoglobin A1c has ranged from 5.9% in 2022, peaking at % in 2023. Patient required assistance for hypoglycemia: yes - in 2017   Transitioned care from Dr. Beryl 09/2021.   On his initial visit to our clinic his A1c was 6.0 % on Trulicity  and Synjary but had to switch Synjardy to Metforin and Jardiance  due to insurance changes    Switched Trulicity  to Mounjaro  07/2023  Discontinued Metformin  with an A1c 5.8% 10/2023   HTN HISTORY: Due to history of uncontrolled HTN and hypokalemia he was screened for Cushing syndrome and hyperaldosteronism.  Patient was noted with suppressed renin activity <0.167 , elevated Aldo/renin ratio >41.3, and an aldosterone level of 6.9.  Saliva cortisol was performed, but the patient was only able to submit 1 sample which was abnormal at 0.12 (reference 0.09) in 08/2023  Dexamethasone  suppression test was slightly elevated at 1.9 Mg/DL in 04/7973  24 hr urine cortisol was normal at 47.6 mcg in 10/2023  CT imaging of the abdomen 09/06/2023 revealed 2 left adrenal adenomas measuring up to 11 mm.  No further imaging indicated   Patient is s/p robotic LEFT  adrenalectomy 09/05/2024 with pathology report of micronodular adrenal cortical hyperplasia no malignancy  He did not require hydrocortisone postoperatively   SUBJECTIVE:   During the last visit (09/23/2024): A1c 6.1 %     Today (10/24/24): Miguel Craig  is here for a follow up on diabetes management. He has been checking glucose occasionally    Weight remains stable He has noted dizziness when he first gets up in the morning  On CPAP  No nausea  No diarrhea  No tingling and numbness    HOME DIABETES REGIMEN: Mounjaro  7.5 mg weekly  Jardiance  25 mg weekly  amlodipine  10 mg, half tablet daily Continue metoprolol  25 mg daily Olmesratan 40 mg daily      Statin: yes ACE-I/ARB: yes    METER DOWNLOAD SUMMARY: n/a   DIABETIC COMPLICATIONS: Microvascular complications:   Denies: CKD retinopathy, neuropathy  Last eye exam: 10/23/2023  Macrovascular complications:   Denies: CAD, PVD, CVA   PAST HISTORY: Past Medical History:  Past Medical History:  Diagnosis Date   Acquired trigger finger of right index finger 10/25/2021   Asthma    Confusion 11/08/2016   Formatting of this note might be different from the original. ---Jan 2016-Sleep Study---Obstructive Sleep Apnea  ---Jan 2018-MRI---No acute intracranial abnormality. Partially empty sella. Mild proptosis minimal paranasal sinus   Cyst on ear    left   Diabetes mellitus without complication (HCC)    Dizziness 10/19/2021   Dyslipidemia 10/19/2021   Elevated liver enzymes 01/05/2016   Formatting of this note might be different from the original. US  liver ordered 01/05/16 Findings consistent with nonalcoholic fatty liver disease, lifestyle interventions discussed with the patient 02/02/16 Hep screening negative  Epidermoid cyst of ear 08/26/2018   Hyperlipidemia    Hypertension    Hypokalemia 09/16/2016   Internal and external bleeding hemorrhoids 11/13/2018   Memory loss 11/08/2016   Formatting of this note might be different from the original. ---Jan 2016-Sleep Study---Obstructive Sleep Apnea  ---Jan 2018-MRI---No acute intracranial abnormality. Partially empty sella. Mild proptosis minimal paranasal sinus disease   Sleep apnea    uses CPAP nightly   Past  Surgical History:  Past Surgical History:  Procedure Laterality Date   ANKLE SURGERY Left    achilles tendon    Social History:  reports that he has been smoking cigarettes. He has a 16 pack-year smoking history. He has never used smokeless tobacco. He reports that he does not currently use alcohol. He reports that he does not use drugs. Family History:  Family History  Problem Relation Age of Onset   Hypertension Mother    Diabetes Father    Hyperlipidemia Father    Colon cancer Neg Hx    Esophageal cancer Neg Hx    Pancreatic cancer Neg Hx    Stomach cancer Neg Hx    Liver disease Neg Hx    Colon polyps Neg Hx    Rectal cancer Neg Hx      HOME MEDICATIONS: Allergies as of 10/24/2024       Reactions   Narcan  [naloxone ] Other (See Comments)   Lethargic, sleepy, disoriented, altered mental status   Hydrocodone -acetaminophen  Nausea Only   Penicillins Other (See Comments)        Medication List        Accurate as of October 24, 2024  9:42 AM. If you have any questions, ask your nurse or doctor.          amLODipine  10 MG tablet Commonly known as: NORVASC  Take 0.5 tablets (5 mg total) by mouth daily.   aspirin  EC 81 MG tablet Take 1 tablet (81 mg total) by mouth daily. Swallow whole.   atorvastatin  40 MG tablet Commonly known as: LIPITOR TAKE 1 TABLET BY MOUTH EVERY DAY   cholecalciferol 25 MCG (1000 UNIT) tablet Commonly known as: VITAMIN D3 Take 1,000 Units by mouth daily.   empagliflozin  25 MG Tabs tablet Commonly known as: Jardiance  Take 1 tablet (25 mg total) by mouth daily before breakfast.   glucose blood test strip SMARTSIG:1 Strip(s) Via Meter Daily   OneTouch Verio test strip Generic drug: glucose blood 1 each by Other route daily in the afternoon. Use as instructed   metoprolol  succinate 25 MG 24 hr tablet Commonly known as: TOPROL -XL Take 1 tablet (25 mg total) by mouth at bedtime.   Mounjaro  7.5 MG/0.5ML Pen Generic drug:  tirzepatide  INJECT 7.5 MG SUBCUTANEOUSLY WEEKLY   naproxen  500 MG tablet Commonly known as: NAPROSYN  TAKE 1 TABLET BY MOUTH 2 TIMES DAILY WITH A MEAL.   olmesartan  40 MG tablet Commonly known as: BENICAR  Take 40 mg by mouth daily.   OneTouch Verio w/Device Kit 1 Device by Does not apply route daily in the afternoon.   oxycodone  5 MG capsule Commonly known as: OXY-IR Take 1 capsule (5 mg total) by mouth every 4 (four) hours as needed (Take after eating.).         ALLERGIES: Allergies  Allergen Reactions   Narcan  [Naloxone ] Other (See Comments)    Lethargic, sleepy, disoriented, altered mental status   Hydrocodone -Acetaminophen  Nausea Only   Penicillins Other (See Comments)     REVIEW OF SYSTEMS: A comprehensive ROS was conducted with the  patient and is negative except as per HPI     OBJECTIVE:   VITAL SIGNS: BP 128/88   Pulse 77   Ht 5' 9 (1.753 m)   Wt 266 lb (120.7 kg)   BMI 39.28 kg/m     Filed Weights   10/24/24 0940  Weight: 266 lb (120.7 kg)       PHYSICAL EXAM:  General: Pt appears well and is in NAD  Lungs: Clear with good BS bilat   Heart: RRR   Abdomen: soft, nontender  Extremities: Trace  pretibial edema  Neuro: MS is good with appropriate affect, pt is alert and Ox3    DM foot exam: 09/23/2024  The skin of the feet is intact without sores or ulcerations. The pedal pulses are 2+ on right and 2+ on left. The sensation is intact to a screening 5.07, 10 gram monofilament bilaterally    DATA REVIEWED:  Lab Results  Component Value Date   HGBA1C 6.1 (A) 09/23/2024   HGBA1C 5.5 02/12/2024   HGBA1C 5.8 (A) 11/08/2023   10/10/2024 through [facility]  Serum cortisol 4 ACTH  20 DHEA-S 62 Renin 1.4  09/23/2024 through [facility] Sodium 140 Potassium 4.3 BUN 15 Creatinine 1.2 GFR 75   ASSESSMENT / PLAN / RECOMMENDATIONS:   1) Type 2 Diabetes Mellitus, Optimally controlled, Without complications - Most recent A1c of 6.1%. Goal A1c < 7.0  %.    -A1c remains optimal -We discontinued Metformin  due to an A1c 5.8%  - No changes at this time   MEDICATIONS: Continue Mounjaro  7.5 mg weekly Continue Jardiance  25 mg daily  EDUCATION / INSTRUCTIONS: BG monitoring instructions: Patient is instructed to check his blood sugars 1 times a day, fasting . Call [facility] if: BG persistently < 70  I reviewed the Rule of 15 for the treatment of hypoglycemia in detail with the patient. Literature supplied.   2) Diabetic complications:  Eye: Does not have known diabetic retinopathy.  Neuro/ Feet: Does not have known diabetic peripheral neuropathy. Renal: Patient does not have known baseline CKD. He is  on an ACEI/ARB at present.     3) Dyslipidemia :  - Tolerating atorvastatin , no changes at this time  Medication Continue atorvastatin  40 mg daily   4) s/p left adrenalectomy/Primary hyperaldosteronism  -S/p left adrenalectomy on 09/05/2024.  -He continues to be dizzy especially in the morning, but this typically resolves.  He did see [facility] in the interim, serum cortisol around 10 AM was low at 4, normal ACTH . -He was prescribed hydrocortisone at discharge, but he only took 1-2 tablets and has not used it since. -I personally believe his dizziness may be related to his low BP, we have been cutting down on amlodipine  -Today we discussed proceeding with cosyntropin  stimulation test -Historically the had 1 sample of saliva cortisol submitted (instead of 2) which was abnormal, dexamethasone  suppression test was slightly elevated at 1.9 - 24-hour urinary cortisol was normal and catecholamines showed slight elevated of epinephrine at 25 mcg with elevated creatinine - I will discontinue amlodipine   Medication :  Stop amlodipine  10 mg, half tablet daily Continue metoprolol  25 mg daily Olmesratan 40 mg daily   F/U 4 months    Signed electronically by: Stefano Redgie Butts, MD  [facility]  Endocrinology  [facility] [address] [place], [place] [address] Phone: [phone] FAX: [phone]   CC: Lucius Krabbe, NP [address] [place] [address] Phone: [phone]  Fax: [phone]  Return to Endocrinology clinic as below: Future Appointments  Date Time Provider Department Center  10/24/2024  9:50 AM Nicholas Ossa, Donell Cardinal, MD [facility] None  11/27/2024  8:20 AM Thukkani, Arun K, MD [facility]  01/14/2025  8:00 AM Lucius Krabbe, NP [facility]     "

## 2024-10-24 NOTE — Patient Instructions (Signed)
Discontinue amlodipine.

## 2024-10-26 ENCOUNTER — Ambulatory Visit: Payer: Self-pay | Admitting: Internal Medicine

## 2024-10-26 MED ORDER — EMPAGLIFLOZIN 25 MG PO TABS: 25.0000 mg | ORAL_TABLET | Freq: Every day | ORAL | 3 refills | Status: AC

## 2024-10-26 MED ORDER — MOUNJARO 7.5 MG/0.5ML ~~LOC~~ SOAJ: 7.5000 mg | SUBCUTANEOUS | 3 refills | Status: AC

## 2024-10-27 LAB — BASIC METABOLIC PANEL WITH GFR
BUN: 15 mg/dL (ref 7–25)
CO2: 27 mmol/L (ref 20–32)
Calcium: 9.1 mg/dL (ref 8.6–10.3)
Chloride: 106 mmol/L (ref 98–110)
Creat: 1.17 mg/dL (ref 0.60–1.29)
Glucose, Bld: 82 mg/dL (ref 65–99)
Potassium: 3.8 mmol/L (ref 3.5–5.3)
Sodium: 140 mmol/L (ref 135–146)
eGFR: 77 mL/min/{1.73_m2}

## 2024-10-27 LAB — CORTISOL
Cortisol, Plasma: 6.9 ug/dL
Cortisol, Plasma: 12.6 ug/dL
Cortisol, Plasma: 14.3 ug/dL

## 2024-11-27 ENCOUNTER — Ambulatory Visit: Admitting: Internal Medicine

## 2025-01-13 ENCOUNTER — Ambulatory Visit: Admitting: Family

## 2025-01-14 ENCOUNTER — Ambulatory Visit: Admitting: Family

## 2025-02-17 ENCOUNTER — Ambulatory Visit: Admitting: Internal Medicine
